# Patient Record
Sex: Female | Born: 1985 | Race: Black or African American | Hispanic: No | Marital: Single | Smoking: Current every day smoker
Health system: Southern US, Community
[De-identification: ages and names within clinical notes are randomized; demographics above are authoritative.]

## PROBLEM LIST (undated history)

## (undated) DIAGNOSIS — F1290 Cannabis use, unspecified, uncomplicated: Secondary | ICD-10-CM

## (undated) DIAGNOSIS — F1410 Cocaine abuse, uncomplicated: Secondary | ICD-10-CM

## (undated) DIAGNOSIS — K219 Gastro-esophageal reflux disease without esophagitis: Secondary | ICD-10-CM

## (undated) DIAGNOSIS — K8010 Calculus of gallbladder with chronic cholecystitis without obstruction: Secondary | ICD-10-CM

## (undated) DIAGNOSIS — R569 Unspecified convulsions: Secondary | ICD-10-CM

## (undated) DIAGNOSIS — D649 Anemia, unspecified: Secondary | ICD-10-CM

## (undated) DIAGNOSIS — R7989 Other specified abnormal findings of blood chemistry: Secondary | ICD-10-CM

## (undated) DIAGNOSIS — N390 Urinary tract infection, site not specified: Secondary | ICD-10-CM

## (undated) DIAGNOSIS — Z720 Tobacco use: Secondary | ICD-10-CM

## (undated) HISTORY — DX: Anemia, unspecified: D64.9

---
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
## 2007-09-06 ENCOUNTER — Ambulatory Visit: Payer: Self-pay | Admitting: Internal Medicine

## 2007-09-15 ENCOUNTER — Inpatient Hospital Stay: Payer: Self-pay | Admitting: Obstetrics and Gynecology

## 2007-10-04 ENCOUNTER — Encounter: Payer: Self-pay | Admitting: Obstetrics and Gynecology

## 2007-10-07 ENCOUNTER — Ambulatory Visit: Payer: Self-pay | Admitting: Internal Medicine

## 2008-01-17 ENCOUNTER — Observation Stay: Payer: Self-pay

## 2008-02-24 ENCOUNTER — Observation Stay: Payer: Self-pay | Admitting: Obstetrics and Gynecology

## 2008-02-28 ENCOUNTER — Observation Stay: Payer: Self-pay | Admitting: Obstetrics and Gynecology

## 2008-03-01 ENCOUNTER — Inpatient Hospital Stay: Payer: Self-pay | Admitting: Obstetrics and Gynecology

## 2008-03-01 ENCOUNTER — Observation Stay: Payer: Self-pay

## 2009-08-28 ENCOUNTER — Emergency Department: Payer: Self-pay | Admitting: Emergency Medicine

## 2010-07-21 ENCOUNTER — Emergency Department: Payer: Self-pay | Admitting: Emergency Medicine

## 2011-05-28 ENCOUNTER — Observation Stay: Payer: Self-pay | Admitting: Surgery

## 2011-06-02 ENCOUNTER — Ambulatory Visit: Payer: Self-pay | Admitting: Surgery

## 2011-06-07 ENCOUNTER — Ambulatory Visit: Payer: Self-pay | Admitting: Cardiothoracic Surgery

## 2011-06-19 ENCOUNTER — Ambulatory Visit: Payer: Self-pay | Admitting: Surgery

## 2014-09-18 ENCOUNTER — Emergency Department: Payer: Self-pay | Admitting: Emergency Medicine

## 2014-09-18 LAB — BASIC METABOLIC PANEL
Anion Gap: 6 — ABNORMAL LOW (ref 7–16)
BUN: 15 mg/dL (ref 7–18)
Calcium, Total: 8.9 mg/dL (ref 8.5–10.1)
Chloride: 105 mmol/L (ref 98–107)
Co2: 27 mmol/L (ref 21–32)
Creatinine: 0.96 mg/dL (ref 0.60–1.30)
EGFR (African American): >60
EGFR (Non-African Amer.): >60
Glucose: 88 mg/dL (ref 65–99)
Osmolality: 276 (ref 275–301)
Potassium: 3.4 mmol/L — ABNORMAL LOW (ref 3.5–5.1)
Sodium: 138 mmol/L (ref 136–145)

## 2014-09-18 LAB — URINALYSIS, COMPLETE
Glucose,UR: NEGATIVE mg/dL (ref 0–75)
Nitrite: NEGATIVE
Ph: 6 (ref 4.5–8.0)
Protein: 100
RBC,UR: 54 /HPF (ref 0–5)
Specific Gravity: 1.029 (ref 1.003–1.030)
Squamous Epithelial: 14
WBC UR: 3 /HPF (ref 0–5)

## 2014-09-18 LAB — WET PREP, GENITAL

## 2014-09-18 LAB — CBC
HCT: 36.8 % (ref 35.0–47.0)
HGB: 11.8 g/dL — ABNORMAL LOW (ref 12.0–16.0)
MCH: 29.9 pg (ref 26.0–34.0)
MCHC: 32.1 g/dL (ref 32.0–36.0)
MCV: 93 fL (ref 80–100)
Platelet: 174 10*3/uL (ref 150–440)
RBC: 3.95 10*6/uL (ref 3.80–5.20)
RDW: 13 % (ref 11.5–14.5)
WBC: 4.2 10*3/uL (ref 3.6–11.0)

## 2014-09-18 LAB — GC/CHLAMYDIA PROBE AMP

## 2016-12-03 ENCOUNTER — Emergency Department
Admission: EM | Admit: 2016-12-03 | Discharge: 2016-12-03 | Disposition: A | Discharge status: Home / Self Care | Payer: Self-pay | Attending: Emergency Medicine | Admitting: Emergency Medicine

## 2016-12-03 ENCOUNTER — Encounter: Payer: Self-pay | Admitting: *Deleted

## 2016-12-03 DIAGNOSIS — F17200 Nicotine dependence, unspecified, uncomplicated: Secondary | ICD-10-CM | POA: Insufficient documentation

## 2016-12-03 DIAGNOSIS — H9201 Otalgia, right ear: Secondary | ICD-10-CM

## 2016-12-03 DIAGNOSIS — H6121 Impacted cerumen, right ear: Secondary | ICD-10-CM | POA: Insufficient documentation

## 2016-12-03 MED ORDER — AMOXICILLIN 500 MG PO CAPS: 500.0000 mg | ORAL_CAPSULE | Freq: Three times a day (TID) | ORAL | 0 refills | Status: AC

## 2016-12-03 NOTE — ED Provider Notes (Signed)
[facility] Emergency Department Provider Note  ____________________________________________  Time seen: Approximately 4:38 PM  I have reviewed the triage vital signs and the nursing notes.   HISTORY  Chief Complaint Otalgia    HPI Tammy Frey is a 31 y.o. female presenting to the emergency department with 6/10 right otalgia for approximately 1 week. Patient denies hearing loss or right ear discharge. Patient states that she felt "warm" last night. However, she has not evaluated her temperature. Patient states that she had recurrent middle ear infections in childhood. Patient cannot recall her last ear infection. Patient has been taking Tylenol but has attempted no other alleviating measures. Patient denies chest pain, chest tightness, shortness of breath, abdominal pain, nausea and vomiting.    History reviewed. No pertinent past medical history.  There are no active problems to display for this patient.   History reviewed. No pertinent surgical history.  Prior to Admission medications   Medication Sig Start Date End Date Taking? Authorizing Provider  amoxicillin (AMOXIL) 500 MG capsule Take 1 capsule (500 mg total) by mouth 3 (three) times daily. 12/03/16 12/13/16  Orvil Feil, PA-C    Allergies Patient has no known allergies.  No family history on file.  Social History Social History  Substance Use Topics  . Smoking status: Current Every Day Smoker  . Smokeless tobacco: Never Used  . Alcohol use Yes     Comment: occasionally    Review of Systems  Constitutional: No fever/chills Eyes: No visual changes. No discharge ENT: Patient has right otalgia.  Cardiovascular: no chest pain. Respiratory: no cough. No SOB. Gastrointestinal: No abdominal pain.  No nausea, no vomiting.  No diarrhea.  No constipation. Skin: Negative for rash, abrasions, lacerations, ecchymosis. Neurological: Negative for headaches, focal weakness or  numbness. ____________________________________________   PHYSICAL EXAM:  VITAL SIGNS: ED Triage Vitals  Enc Vitals Group     BP 12/03/16 1559 116/75     Pulse Rate 12/03/16 1559 (!) 51     Resp 12/03/16 1559 18     Temp 12/03/16 1559 98.5 F (36.9 C)     Temp Source 12/03/16 1559 Oral     SpO2 12/03/16 1559 100 %     Weight 12/03/16 1601 140 lb (63.5 kg)     Height 12/03/16 1601 5\' 7"  (1.702 m)     Head Circumference --      Peak Flow --      Pain Score 12/03/16 1601 6     Pain Loc --      Pain Edu? --      Excl. in GC? --     Constitutional: Alert and oriented. Well appearing and in no acute distress. Eyes: Conjunctivae are normal. PERRL. EOMI. Head: Atraumatic. ENT:      Ears: Patient's right external auditory canal is erythematous. Right tympanic membrane cannot be visualized due to impacted cerumen. Left tympanic membrane is pearly without evidence of erythema or effusion. Bony landmarks visualized, left.      Nose: No congestion/rhinnorhea.      Mouth/Throat: Mucous membranes are moist.  Cardiovascular: Normal rate, regular rhythm. Normal S1 and S2.  Good peripheral circulation. Respiratory: Normal respiratory effort without tachypnea or retractions. Lungs CTAB. Good air entry to the bases with no decreased or absent breath sounds. Gastrointestinal: Bowel sounds 4 quadrants. Soft and nontender to palpation. No guarding or rigidity. No palpable masses. No distention. No CVA tenderness. Musculoskeletal: Full range of motion to all extremities. No gross deformities appreciated.  Neurologic:  Normal speech and language. No gross focal neurologic deficits are appreciated.  Skin:  Skin is warm, dry and intact. No rash noted. ___________________________________________   LABS (all labs ordered are listed, but only abnormal results are displayed)  Labs Reviewed - No data to  display ____________________________________________  EKG   ____________________________________________  RADIOLOGY  No results found.  ____________________________________________    PROCEDURES  Procedure(s) performed:    Procedures  Cerumen disimpaction was attempted with a Q-tip.  Medications - No data to display   ____________________________________________   INITIAL IMPRESSION / ASSESSMENT AND PLAN / ED COURSE  Pertinent labs & imaging results that were available during my care of the patient were reviewed by me and considered in my medical decision making (see chart for details).  Review of the [place] CSRS was performed in accordance of the [facility] prior to dispensing any controlled drugs.     Assessment and Plan:  Right Otalgia:  Patient presents to the emergency department with right otalgia for approximately 1 week. Right tympanic membrane cannot be entirely visualized due to impacted cerumen despite attempted cerumen disimpaction in the emergency department. Patient was treated empirically with amoxicillin. Patient was advised to follow-up with her primary care provider in one week for further cerumen disempaction. All patient questions were answered.   ____________________________________________  FINAL CLINICAL IMPRESSION(S) / ED DIAGNOSES  Final diagnoses:  Acute otalgia, right      NEW MEDICATIONS STARTED DURING THIS VISIT:  New Prescriptions   AMOXICILLIN (AMOXIL) 500 MG CAPSULE    Take 1 capsule (500 mg total) by mouth 3 (three) times daily.        This chart was dictated using voice recognition software/Dragon. Despite best efforts to proofread, errors can occur which can change the meaning. Any change was purely unintentional.    Orvil FeilJaclyn M Indiyah Paone, PA-C 12/03/16 1652    Minna AntisKevin Paduchowski, MD 12/06/16 [phone]

## 2016-12-03 NOTE — ED Triage Notes (Signed)
Patient c/o right ear infection.

## 2017-03-05 ENCOUNTER — Encounter: Payer: Self-pay | Admitting: Emergency Medicine

## 2017-03-05 ENCOUNTER — Emergency Department
Admission: EM | Admit: 2017-03-05 | Discharge: 2017-03-05 | Disposition: A | Discharge status: Home / Self Care | Payer: Medicaid Other | Attending: Emergency Medicine | Admitting: Emergency Medicine

## 2017-03-05 DIAGNOSIS — F10921 Alcohol use, unspecified with intoxication delirium: Secondary | ICD-10-CM

## 2017-03-05 DIAGNOSIS — F1910 Other psychoactive substance abuse, uncomplicated: Secondary | ICD-10-CM

## 2017-03-05 DIAGNOSIS — F10121 Alcohol abuse with intoxication delirium: Secondary | ICD-10-CM | POA: Insufficient documentation

## 2017-03-05 DIAGNOSIS — F17200 Nicotine dependence, unspecified, uncomplicated: Secondary | ICD-10-CM | POA: Insufficient documentation

## 2017-03-05 DIAGNOSIS — F1210 Cannabis abuse, uncomplicated: Secondary | ICD-10-CM | POA: Insufficient documentation

## 2017-03-05 HISTORY — DX: Unspecified convulsions: R56.9

## 2017-03-05 LAB — BASIC METABOLIC PANEL
Anion gap: 8 (ref 5–15)
BUN: 8 mg/dL (ref 6–20)
CO2: 25 mmol/L (ref 22–32)
Calcium: 8.7 mg/dL — ABNORMAL LOW (ref 8.9–10.3)
Chloride: 110 mmol/L (ref 101–111)
Creatinine, Ser: 0.9 mg/dL (ref 0.44–1.00)
GFR calc Af Amer: >60 mL/min (ref >60)
GFR calc non Af Amer: >60 mL/min (ref >60)
Glucose, Bld: 86 mg/dL (ref 65–99)
Potassium: 3.1 mmol/L — ABNORMAL LOW (ref 3.5–5.1)
Sodium: 143 mmol/L (ref 135–145)

## 2017-03-05 LAB — GLUCOSE, CAPILLARY: Glucose-Capillary: 102 mg/dL — ABNORMAL HIGH (ref 65–99)

## 2017-03-05 LAB — CBC
HCT: 36.6 % (ref 35.0–47.0)
Hemoglobin: 12.4 g/dL (ref 12.0–16.0)
MCH: 32 pg (ref 26.0–34.0)
MCHC: 33.7 g/dL (ref 32.0–36.0)
MCV: 94.9 fL (ref 80.0–100.0)
Platelets: 164 10*3/uL (ref 150–440)
RBC: 3.86 MIL/uL (ref 3.80–5.20)
RDW: 13.3 % (ref 11.5–14.5)
WBC: 3.9 10*3/uL (ref 3.6–11.0)

## 2017-03-05 LAB — ETHANOL: Alcohol, Ethyl (B): 177 mg/dL — ABNORMAL HIGH (ref <5)

## 2017-03-05 MED ORDER — LORAZEPAM 2 MG/ML IJ SOLN
2.0000 mg | Freq: Once | INTRAMUSCULAR | Status: AC
  Administered 2017-03-05: 2 mg via INTRAMUSCULAR

## 2017-03-05 MED ORDER — HALOPERIDOL LACTATE 5 MG/ML IJ SOLN
5.0000 mg | Freq: Once | INTRAMUSCULAR | Status: AC
  Administered 2017-03-05: 5 mg via INTRAMUSCULAR

## 2017-03-05 MED ORDER — DIPHENHYDRAMINE HCL 50 MG/ML IJ SOLN
50.0000 mg | Freq: Once | INTRAMUSCULAR | Status: AC
  Administered 2017-03-05: 50 mg via INTRAMUSCULAR

## 2017-03-05 NOTE — ED Notes (Signed)
Mom in room with patient. Mom admits that patient has been using drugs and snorting stuff. Unable to get urine sample at this time

## 2017-03-05 NOTE — ED Provider Notes (Signed)
[facility] Emergency Department Provider Note    First MD Initiated Contact with Patient 03/05/17 [phone]     (approximate)  I have reviewed the triage vital signs and the nursing notes.  Level V caveat: Altered mental status combative HISTORY  Chief Complaint Altered Mental Status    HPI Tammy Frey is a 31 y.o. female presents to the emergency department via EMS with altered mental status. Per EMS the patient was drinking alcohol and smoking marijuana subsequently became very agitated. Patient very combative upon arrival to the emergency department actively grabbing with the EMS personnel via the neck.   Past Medical History:  Diagnosis Date  . Seizures (HCC)     There are no active problems to display for this patient.   History reviewed. No pertinent surgical history.  Prior to Admission medications   Not on File    Allergies None per the patient's mother.   Family history Noncontributory  Social History Social History  Substance Use Topics  . Smoking status: Current Every Day Smoker  . Smokeless tobacco: Never Used  . Alcohol use Yes     Comment: occasionally    Review of Systems Constitutional: No fever/chills Eyes: No visual changes. ENT: No sore throat. Cardiovascular: Denies chest pain. Respiratory: Denies shortness of breath. Gastrointestinal: No abdominal pain.  No nausea, no vomiting.  No diarrhea.  No constipation. Genitourinary: Negative for dysuria. Musculoskeletal: Negative for neck pain.  Negative for back pain. Integumentary: Negative for rash. Neurological: Negative for headaches, focal weakness or numbness. Psychiatric:Positive for EtOH ingestion and marijuana use tonight. Altered mental status   ____________________________________________   PHYSICAL EXAM:  VITAL SIGNS: ED Triage Vitals  Enc Vitals Group     BP 03/05/17 0440 113/72     Pulse Rate 03/05/17 0440 83     Resp 03/05/17 0440 20     Temp 03/05/17 0440 98.4 F (36.9 C)     Temp Source 03/05/17 0440 Oral     SpO2 03/05/17 0440 97 %     Weight 03/05/17 0441 63.9 kg (140 lb 14 oz)     Height 03/05/17 0441 1.676 m (5\' 6" )     Head Circumference --      Peak Flow --      Pain Score --      Pain Loc --      Pain Edu? --      Excl. in GC? --     Constitutional: Alert, combative agitated  Eyes: Conjunctivae are normal.  Head: Atraumatic. Mouth/Throat: Mucous membranes are moist. Oropharynx non-erythematous. Neck: No stridor. Cardiovascular: Normal rate, regular rhythm. Good peripheral circulation. Grossly normal heart sounds. Respiratory: Normal respiratory effort.  No retractions. Lungs CTAB. Gastrointestinal: Soft and nontender. No distention.  Musculoskeletal: No lower extremity tenderness nor edema. No gross deformities of extremities. Neurologic:  Normal speech and language. No gross focal neurologic deficits are appreciated.  Skin:  Skin is warm, dry and intact. No rash noted. Psychiatric: Combative, agitated, ____________________________________________   LABS (all labs ordered are listed, but only abnormal results are displayed)  Labs Reviewed  GLUCOSE, CAPILLARY - Abnormal; Notable for the following:       Result Value   Glucose-Capillary 102 (*)    All other components within normal limits  BASIC METABOLIC PANEL - Abnormal; Notable for the following:    Potassium 3.1 (*)    Calcium 8.7 (*)    All other components within normal limits  ETHANOL - Abnormal; Notable for  the following:    Alcohol, Ethyl (B) 177 (*)    All other components within normal limits  CBC  URINE DRUG SCREEN, QUALITATIVE ([facility] ONLY)   _  Procedures   ____________________________________________   INITIAL IMPRESSION / ASSESSMENT AND PLAN / ED COURSE  Pertinent labs & imaging results that were available during my care of the patient were reviewed by me and considered in my medical decision making (see chart for  details).  Patient presents the emergency department combative and agitated and salts posing a risk not only to herself but staff. Given this clinical finding patient received Haldol 5 mg Ativan 2 mg and Benadryl 50 mg for chemical sedation. Patient's combativeness and agitation gradually improved to resolution within 20 minutes.  Patient's mother arrived to the emergency department and stated that the patient had a similar episode like this before secondary to drug use. She states that she is unsure what drug to her daughter uses however she was told in the past while her daughter that she "snorts".    ----------------------------------------- 6:27 AM on 03/05/2017 -----------------------------------------   OBSERVATION CARE: This patient is being placed under observation care for the following reasons: Alcohol intoxication, altered mental status. 31 year old female presenting with combativeness and altered mental status. EtOH level 177 urine drug screen pending at this time.      FINAL CLINICAL IMPRESSION(S) / ED DIAGNOSES  Final diagnoses:  Alcohol intoxication with delirium (HCC)  Substance abuse     MEDICATIONS GIVEN DURING THIS VISIT:  Medications  diphenhydrAMINE (BENADRYL) injection 50 mg (50 mg Intramuscular Given 03/05/17 0420)  LORazepam (ATIVAN) injection 2 mg (2 mg Intramuscular Given 03/05/17 0420)  haloperidol lactate (HALDOL) injection 5 mg (5 mg Intramuscular Given 03/05/17 0420)     NEW OUTPATIENT MEDICATIONS STARTED DURING THIS VISIT:  New Prescriptions   No medications on file    Modified Medications   No medications on file    Discontinued Medications   No medications on file     Note:  This document was prepared using Dragon voice recognition software and may include unintentional dictation errors.    Darci CurrentBrown, Penryn N, MD 03/05/17 [phone]

## 2017-03-05 NOTE — ED Notes (Signed)
Pt resting after obtaining blood specimens. Nurse unable to finish triage notes due to patient being sleep.

## 2017-03-05 NOTE — ED Triage Notes (Signed)
Pt coming in from EMS combative and AMS. Friend called EMS and said alcohol was involved and she had smoked some weed. EMS states they were told no allergies and hx of seizures. Pt was screaming that she wanted to leave and talking about her daughter Victory DakinRiley.

## 2017-03-05 NOTE — ED Provider Notes (Signed)
-----------------------------------------   8:34 AM on 03/05/2017 -----------------------------------------  The patient is resting comfortably. Still unclear if she will require inpatient admission for her possible drug overdose or not. Reevaluation pending.  ----------------------------------------- 10:47 AM on 03/05/2017 -----------------------------------------   END OF OBSERVATION STATUS: After an appropriate period of observation, this patient is being discharged due to the following reason(s):  Awake and appropriate with normal hemodynamics as well as able to eat and drink. Discharged home with her mom.    Merrily Brittleifenbark, Iven Earnhart, MD 03/05/17 1047

## 2017-03-05 NOTE — Discharge Instructions (Signed)
Please make an appointment to establish care with a primary care physician within the next week for recheck. Return to the emergency department sooner for any concerns.  It was a pleasure to take care of you today, and thank you for coming to our emergency department.  If you have any questions or concerns before leaving please ask the nurse to grab me and I'm more than happy to go through your aftercare instructions again.  If you were prescribed any opioid pain medication today such as Norco, Vicodin, Percocet, morphine, hydrocodone, or oxycodone please make sure you do not drive when you are taking this medication as it can alter your ability to drive safely.  If you have any concerns once you are home that you are not improving or are in fact getting worse before you can make it to your follow-up appointment, please do not hesitate to call 911 and come back for further evaluation.  Merrily BrittleNeil Shanda Cadotte MD  Results for orders placed or performed during the hospital encounter of 03/05/17  Glucose, capillary  Result Value Ref Range   Glucose-Capillary 102 (H) 65 - 99 mg/dL  CBC  Result Value Ref Range   WBC 3.9 3.6 - 11.0 K/uL   RBC 3.86 3.80 - 5.20 MIL/uL   Hemoglobin 12.4 12.0 - 16.0 g/dL   HCT 16.136.6 09.635.0 - 04.547.0 %   MCV 94.9 80.0 - 100.0 fL   MCH 32.0 26.0 - 34.0 pg   MCHC 33.7 32.0 - 36.0 g/dL   RDW 40.913.3 81.111.5 - 91.414.5 %   Platelets 164 150 - 440 K/uL  Basic metabolic panel  Result Value Ref Range   Sodium 143 135 - 145 mmol/L   Potassium 3.1 (L) 3.5 - 5.1 mmol/L   Chloride 110 101 - 111 mmol/L   CO2 25 22 - 32 mmol/L   Glucose, Bld 86 65 - 99 mg/dL   BUN 8 6 - 20 mg/dL   Creatinine, Ser 7.820.90 0.44 - 1.00 mg/dL   Calcium 8.7 (L) 8.9 - 10.3 mg/dL   GFR calc non Af Amer >60 >60 mL/min   GFR calc Af Amer >60 >60 mL/min   Anion gap 8 5 - 15  Ethanol  Result Value Ref Range   Alcohol, Ethyl (B) 177 (H) <5 mg/dL

## 2017-03-05 NOTE — ED Notes (Signed)
Pt resting in bed, family at bedside, resp even and unlabored, eyes closed 

## 2017-03-05 NOTE — ED Notes (Signed)
Pt resting in bed.  Family at bedside.

## 2017-03-06 ENCOUNTER — Emergency Department
Admission: EM | Admit: 2017-03-06 | Discharge: 2017-03-06 | Disposition: A | Discharge status: Home / Self Care | Payer: Medicaid Other | Attending: Emergency Medicine | Admitting: Emergency Medicine

## 2017-03-06 DIAGNOSIS — F1290 Cannabis use, unspecified, uncomplicated: Secondary | ICD-10-CM | POA: Insufficient documentation

## 2017-03-06 DIAGNOSIS — T424X5A Adverse effect of benzodiazepines, initial encounter: Secondary | ICD-10-CM | POA: Insufficient documentation

## 2017-03-06 DIAGNOSIS — F17200 Nicotine dependence, unspecified, uncomplicated: Secondary | ICD-10-CM | POA: Insufficient documentation

## 2017-03-06 DIAGNOSIS — F1910 Other psychoactive substance abuse, uncomplicated: Secondary | ICD-10-CM | POA: Insufficient documentation

## 2017-03-06 DIAGNOSIS — G2402 Drug induced acute dystonia: Secondary | ICD-10-CM | POA: Insufficient documentation

## 2017-03-06 LAB — CBC WITH DIFFERENTIAL/PLATELET
Basophils Absolute: 0.1 10*3/uL (ref 0–0.1)
Basophils Relative: 1 %
Eosinophils Absolute: 0 10*3/uL (ref 0–0.7)
Eosinophils Relative: 1 %
HCT: 42.9 % (ref 35.0–47.0)
Hemoglobin: 14.2 g/dL (ref 12.0–16.0)
Lymphocytes Relative: 27 %
Lymphs Abs: 2 10*3/uL (ref 1.0–3.6)
MCH: 31.5 pg (ref 26.0–34.0)
MCHC: 33.2 g/dL (ref 32.0–36.0)
MCV: 94.9 fL (ref 80.0–100.0)
Monocytes Absolute: 0.8 10*3/uL (ref 0.2–0.9)
Monocytes Relative: 10 %
Neutro Abs: 4.6 10*3/uL (ref 1.4–6.5)
Neutrophils Relative %: 61 %
Platelets: 208 10*3/uL (ref 150–440)
RBC: 4.53 MIL/uL (ref 3.80–5.20)
RDW: 12.9 % (ref 11.5–14.5)
WBC: 7.5 10*3/uL (ref 3.6–11.0)

## 2017-03-06 LAB — ETHANOL: Alcohol, Ethyl (B): <5 mg/dL (ref <5)

## 2017-03-06 LAB — COMPREHENSIVE METABOLIC PANEL
ALT: 21 U/L (ref 14–54)
AST: 47 U/L — ABNORMAL HIGH (ref 15–41)
Albumin: 5.2 g/dL — ABNORMAL HIGH (ref 3.5–5.0)
Alkaline Phosphatase: 53 U/L (ref 38–126)
Anion gap: 13 (ref 5–15)
BUN: 15 mg/dL (ref 6–20)
CO2: 21 mmol/L — ABNORMAL LOW (ref 22–32)
Calcium: 9.7 mg/dL (ref 8.9–10.3)
Chloride: 105 mmol/L (ref 101–111)
Creatinine, Ser: 1.13 mg/dL — ABNORMAL HIGH (ref 0.44–1.00)
GFR calc Af Amer: >60 mL/min (ref >60)
GFR calc non Af Amer: >60 mL/min (ref >60)
Glucose, Bld: 114 mg/dL — ABNORMAL HIGH (ref 65–99)
Potassium: 4.4 mmol/L (ref 3.5–5.1)
Sodium: 139 mmol/L (ref 135–145)
Total Bilirubin: 0.7 mg/dL (ref 0.3–1.2)
Total Protein: 8.8 g/dL — ABNORMAL HIGH (ref 6.5–8.1)

## 2017-03-06 LAB — URINE DRUG SCREEN, QUALITATIVE (ARMC ONLY)
Amphetamines, Ur Screen: NOT DETECTED
Barbiturates, Ur Screen: NOT DETECTED
Benzodiazepine, Ur Scrn: POSITIVE — AB
Cannabinoid 50 Ng, Ur ~~LOC~~: POSITIVE — AB
Cocaine Metabolite,Ur ~~LOC~~: POSITIVE — AB
MDMA (Ecstasy)Ur Screen: NOT DETECTED
Methadone Scn, Ur: NOT DETECTED
Opiate, Ur Screen: NOT DETECTED
Phencyclidine (PCP) Ur S: NOT DETECTED
Tricyclic, Ur Screen: NOT DETECTED

## 2017-03-06 LAB — POCT PREGNANCY, URINE: Preg Test, Ur: NEGATIVE

## 2017-03-06 MED ORDER — SODIUM CHLORIDE 0.9 % IV SOLN
1000.0000 mL | Freq: Once | INTRAVENOUS | Status: AC
  Administered 2017-03-06: 1000 mL via INTRAVENOUS

## 2017-03-06 MED ORDER — DIPHENHYDRAMINE HCL 50 MG/ML IJ SOLN
50.0000 mg | Freq: Once | INTRAMUSCULAR | Status: AC
  Administered 2017-03-06: 50 mg via INTRAVENOUS
  Filled 2017-03-06: qty 1

## 2017-03-06 MED ORDER — LORAZEPAM 2 MG/ML IJ SOLN
1.0000 mg | Freq: Once | INTRAMUSCULAR | Status: AC
  Administered 2017-03-06: 1 mg via INTRAVENOUS
  Filled 2017-03-06: qty 1

## 2017-03-06 MED ORDER — BENZTROPINE MESYLATE 1 MG/ML IJ SOLN
1.0000 mg | Freq: Once | INTRAMUSCULAR | Status: AC
  Administered 2017-03-06: 1 mg via INTRAVENOUS
  Filled 2017-03-06: qty 1

## 2017-03-06 NOTE — ED Notes (Signed)
ED Provider at bedside. 

## 2017-03-06 NOTE — ED Notes (Signed)
Patient resting more comfortably at this time.  Patient able to sit still on the bed and follow directions.  Patient states "I am not twitching as much anymore".  MD notified of improvement post medication administration.  Family of patient still at bedside.

## 2017-03-06 NOTE — ED Notes (Signed)
Patient able to ambulate well to the restroom with no difficulty noted at this time.

## 2017-03-06 NOTE — ED Triage Notes (Signed)
Patient c/o that she is currently seizing. Pt is able to speak and communicate needs. Pt c/o muscle spasm in lower left leg.

## 2017-03-06 NOTE — ED Provider Notes (Signed)
[facility] Emergency Department Provider Note       Time seen: ----------------------------------------- 9:02 PM on 03/06/2017 -----------------------------------------     I have reviewed the triage vital signs and the nursing notes.   HISTORY   Chief Complaint Seizures    HPI Tammy Frey is a 31 y.o. female who presents to the ED for possible seizure. Patient is brought emergently to room #8 for seizure-like activity. She is able to speak and communicate but has obvious spasm of her neck and left leg. Patient was seen in the ER yesterday for alcohol intoxication and possible pseudoseizures. She did receive Ativan, Haldol and Benadryl at that time.   Past Medical History:  Diagnosis Date  . Seizures (HCC)     There are no active problems to display for this patient.   History reviewed. No pertinent surgical history.  Allergies Patient has no known allergies.  Social History Social History  Substance Use Topics  . Smoking status: Current Every Day Smoker  . Smokeless tobacco: Never Used  . Alcohol use Yes     Comment: occasionally    Review of Systems Constitutional: Negative for fever. Eyes: Negative for vision changes ENT:  Negative for congestion, sore throat Cardiovascular: Negative for chest pain. Respiratory: Negative for shortness of breath. Gastrointestinal: Negative for abdominal pain, vomiting and diarrhea. Genitourinary: Negative for dysuria. Musculoskeletal: Positive for leg pain and spasm in the neck Skin: Positive for diaphoresis Neurological: Positive for possible seizure-like event.  All systems negative/normal/unremarkable except as stated in the HPI  ____________________________________________   PHYSICAL EXAM:  VITAL SIGNS: ED Triage Vitals  Enc Vitals Group     BP 03/06/17 2042 (!) 166/123     Pulse Rate 03/06/17 2042 (!) 185     Resp 03/06/17 2042 (!) 24     Temp 03/06/17 2042 99.7 F (37.6  C)     Temp Source 03/06/17 2042 Oral     SpO2 03/06/17 2042 100 %     Weight 03/06/17 2041 150 lb (68 kg)     Height 03/06/17 2041 5\' 9"  (1.753 m)     Head Circumference --      Peak Flow --      Pain Score --      Pain Loc --      Pain Edu? --      Excl. in GC? --     Constitutional: Alert and oriented. Moderate distress Eyes: Conjunctivae are normal. Normal extraocular movements. ENT   Head: Normocephalic, facial grimacing   Nose: No congestion/rhinnorhea.   Mouth/Throat: Mucous membranes are moist.   Neck: No stridor. Left-sided torticollis Cardiovascular: Normal rate, regular rhythm. No murmurs, rubs, or gallops. Respiratory: Normal respiratory effort without tachypnea nor retractions. Breath sounds are clear and equal bilaterally. No wheezes/rales/rhonchi. Gastrointestinal: Soft and nontender. Normal bowel sounds Musculoskeletal: Nontender with normal range of motion in extremities. Palpable spasm of the left lower extremity gastrocnemius muscle, plantar flexion Neurologic:  Normal speech and language. No gross focal neurologic deficits are appreciated.  Skin:  Skin is warm, dry and intact. No rash noted. Psychiatric: Mood and affect are normal. Speech and behavior are normal.  ____________________________________________  EKG: Interpreted by me. Sinus tachycardia with rate of 175 bpm, normal. We'll, normal QRS, normal QT.  Repeat EKG interpreted by me reveals sinus arrhythmia with a rate of 65 bpm, normal PR, normal QRS, normal QT.  ____________________________________________  ED COURSE:  Pertinent labs & imaging results that were available during my care  of the patient were reviewed by me and considered in my medical decision making (see chart for details). Patient presents for Possible dystonic reaction, we will assess with labs and imaging as indicated. Clinical Course as of Mar 06 2237  Fri Mar 06, 2017  2114 Patient has had resolution in her dystonic  reaction after Benadryl and Cogentin  [JW]    Clinical Course User Index [JW] Emily FilbertWilliams, Jonathan E, MD   Procedures ____________________________________________   LABS (pertinent positives/negatives)  Labs Reviewed  COMPREHENSIVE METABOLIC PANEL - Abnormal; Notable for the following:       Result Value   CO2 21 (*)    Glucose, Bld 114 (*)    Creatinine, Ser 1.13 (*)    Total Protein 8.8 (*)    Albumin 5.2 (*)    AST 47 (*)    All other components within normal limits  URINE DRUG SCREEN, QUALITATIVE ([facility] ONLY) - Abnormal; Notable for the following:    Cocaine Metabolite,Ur [place] POSITIVE (*)    Cannabinoid 50 Ng, Ur [place] POSITIVE (*)    Benzodiazepine, Ur [place] POSITIVE (*)    All other components within normal limits  CBC WITH DIFFERENTIAL/PLATELET  ETHANOL  POC URINE PREG, ED  POCT PREGNANCY, URINE  CBG MONITORING, ED   ____________________________________________  FINAL ASSESSMENT AND PLAN  Dystonic reaction, Polysubstance abuse  Plan: Patient's labs and imaging were dictated above. Patient had presented for a dystonic reaction that evidently triggered SVT. After Benadryl and Cogentin symptoms have resolved. She is currently back to her baseline. 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 She is stable for outpatient follow-up.   Emily FilbertWilliams, Jonathan E, MD   Note: This note was generated in part or whole with voice recognition software. Voice recognition is usually quite accurate but there are transcription errors that can and very often do occur. I apologize for any typographical errors that were not detected and corrected.     Emily FilbertWilliams, Jonathan E, MD 03/06/17 2241

## 2017-03-06 NOTE — ED Notes (Signed)
MD made aware that patient's heart rate is 180's and patient in diaphoretic with intense spasms.  MD at brought to bedside for assessment.

## 2017-05-14 ENCOUNTER — Encounter: Payer: Self-pay | Admitting: Emergency Medicine

## 2017-05-14 ENCOUNTER — Emergency Department
Admission: EM | Admit: 2017-05-14 | Discharge: 2017-05-14 | Disposition: A | Discharge status: Home / Self Care | Payer: Medicaid Other | Attending: Emergency Medicine | Admitting: Emergency Medicine

## 2017-05-14 DIAGNOSIS — F17200 Nicotine dependence, unspecified, uncomplicated: Secondary | ICD-10-CM | POA: Insufficient documentation

## 2017-05-14 DIAGNOSIS — A599 Trichomoniasis, unspecified: Secondary | ICD-10-CM | POA: Insufficient documentation

## 2017-05-14 DIAGNOSIS — B9689 Other specified bacterial agents as the cause of diseases classified elsewhere: Secondary | ICD-10-CM

## 2017-05-14 DIAGNOSIS — N760 Acute vaginitis: Secondary | ICD-10-CM | POA: Insufficient documentation

## 2017-05-14 DIAGNOSIS — A749 Chlamydial infection, unspecified: Secondary | ICD-10-CM

## 2017-05-14 DIAGNOSIS — Z113 Encounter for screening for infections with a predominantly sexual mode of transmission: Secondary | ICD-10-CM | POA: Insufficient documentation

## 2017-05-14 LAB — URINALYSIS, COMPLETE (UACMP) WITH MICROSCOPIC
Bacteria, UA: NONE SEEN
Bilirubin Urine: NEGATIVE
Glucose, UA: NEGATIVE mg/dL
Hgb urine dipstick: NEGATIVE
Ketones, ur: 5 mg/dL — AB
Nitrite: NEGATIVE
Protein, ur: NEGATIVE mg/dL
Specific Gravity, Urine: 1.024 (ref 1.005–1.030)
pH: 6 (ref 5.0–8.0)

## 2017-05-14 LAB — CHLAMYDIA/NGC RT PCR (ARMC ONLY)
Chlamydia Tr: DETECTED — AB
N gonorrhoeae: NOT DETECTED

## 2017-05-14 LAB — POCT PREGNANCY, URINE: Preg Test, Ur: NEGATIVE

## 2017-05-14 LAB — WET PREP, GENITAL
Sperm: NONE SEEN
Yeast Wet Prep HPF POC: NONE SEEN

## 2017-05-14 MED ORDER — LIDOCAINE HCL (PF) 1 % IJ SOLN
INTRAMUSCULAR | Status: AC
  Filled 2017-05-14: qty 5

## 2017-05-14 MED ORDER — AZITHROMYCIN 500 MG PO TABS
1000.0000 mg | ORAL_TABLET | Freq: Once | ORAL | Status: AC
  Administered 2017-05-14: 1000 mg via ORAL
  Filled 2017-05-14: qty 2

## 2017-05-14 MED ORDER — CEFTRIAXONE SODIUM 250 MG IJ SOLR
250.0000 mg | Freq: Once | INTRAMUSCULAR | Status: AC
  Administered 2017-05-14: 250 mg via INTRAMUSCULAR
  Filled 2017-05-14: qty 250

## 2017-05-14 MED ORDER — FLUCONAZOLE 150 MG PO TABS: 150.0000 mg | ORAL_TABLET | Freq: Once | ORAL | 0 refills | Status: AC

## 2017-05-14 MED ORDER — METRONIDAZOLE 500 MG PO TABS: 500.0000 mg | ORAL_TABLET | Freq: Two times a day (BID) | ORAL | 0 refills | Status: DC

## 2017-05-14 NOTE — ED Triage Notes (Signed)
Pt reports someone told her she needed to get treated for STD.  Reports person she was with had epididymitis.  Pt has no symptoms.

## 2017-05-14 NOTE — ED Provider Notes (Signed)
[facility] Emergency Department Provider Note  ____________________________________________  Time seen: Approximately 7:26 PM  I have reviewed the triage vital signs and the nursing notes.   HISTORY  Chief Complaint STD    HPI Tammy Frey is a 31 y.o. female who presents to the emergency department complaining of possible STD exposure. Patient reports that recent sexual partner was diagnosed with epididymitis. She is unsure causative organism. She reports that she received a text saying she needed to be tested. Patient denies any external lesions, vaginal discharge or bleeding. Patient denies any abdominal pain, dysuria, polyuria, hematuria. No fevers or chills. No other complaints at this time.     Past Medical History:  Diagnosis Date  . Seizures (HCC)     There are no active problems to display for this patient.   History reviewed. No pertinent surgical history.  Prior to Admission medications   Medication Sig Start Date End Date Taking? Authorizing Provider  fluconazole (DIFLUCAN) 150 MG tablet Take 1 tablet (150 mg total) by mouth once. Take after finishing antibiotic 05/14/17 05/14/17  Israel Werts, Delorise RoyalsJonathan D, PA-C  metroNIDAZOLE (FLAGYL) 500 MG tablet Take 1 tablet (500 mg total) by mouth 2 (two) times daily. 05/14/17   Brigid Vandekamp, Delorise RoyalsJonathan D, PA-C    Allergies Haldol [haloperidol]  History reviewed. No pertinent family history.  Social History Social History  Substance Use Topics  . Smoking status: Current Every Day Smoker  . Smokeless tobacco: Never Used  . Alcohol use Yes     Comment: occasionally     Review of Systems  Constitutional: No fever/chills Eyes: No visual changes. No discharge ENT: No upper respiratory complaints. Cardiovascular: no chest pain. Respiratory: no cough. No SOB. Gastrointestinal: No abdominal pain.  No nausea, no vomiting.  No diarrhea.  No constipation. Genitourinary: Negative for dysuria. No  hematuria. Denies vaginal discharge or bleeding. Musculoskeletal: Negative for musculoskeletal pain. Skin: Negative for rash, abrasions, lacerations, ecchymosis. Neurological: Negative for headaches, focal weakness or numbness. 10-point ROS otherwise negative.  ____________________________________________   PHYSICAL EXAM:  VITAL SIGNS: ED Triage Vitals [05/14/17 1844]  Enc Vitals Group     BP (!) 141/90     Pulse Rate 73     Resp      Temp 98.6 F (37 C)     Temp Source Oral     SpO2 100 %     Weight 145 lb (65.8 kg)     Height 5\' 7"  (1.702 m)     Head Circumference      Peak Flow      Pain Score      Pain Loc      Pain Edu?      Excl. in [place]?      Constitutional: Alert and oriented. Well appearing and in no acute distress. Eyes: Conjunctivae are normal. PERRL. EOMI. Head: Atraumatic. ENT:      Ears:       Nose: No congestion/rhinnorhea.      Mouth/Throat: Mucous membranes are moist.  Neck: No stridor.    Cardiovascular: Normal rate, regular rhythm. Normal S1 and S2.  Good peripheral circulation. Respiratory: Normal respiratory effort without tachypnea or retractions. Lungs CTAB. Good air entry to the bases with no decreased or absent breath sounds. Gastrointestinal: Bowel sounds 4 quadrants. Soft and nontender to palpation. No guarding or rigidity. No palpable masses. No distention. No CVA tenderness. Genitourinary: No visible external lesions. Visualization with speculum reveals no lesions or lacerations to the vaginal wall. Bowel other  is appreciated. Patient does have thick white green discharge. Cervix is unremarkable. Bimanual exam reveals no cervical motion tenderness. No palpable masses in the adnexa.. Musculoskeletal: Full range of motion to all extremities. No gross deformities appreciated. Neurologic:  Normal speech and language. No gross focal neurologic deficits are appreciated.  Skin:  Skin is warm, dry and intact. No rash noted. Psychiatric: Mood and  affect are normal. Speech and behavior are normal. Patient exhibits appropriate insight and judgement.  Pelvic exam chaperoned by ED tech ____________________________________________   LABS (all labs ordered are listed, but only abnormal results are displayed)  Labs Reviewed  WET PREP, GENITAL - Abnormal; Notable for the following:       Result Value   Trich, Wet Prep PRESENT (*)    Clue Cells Wet Prep HPF POC PRESENT (*)    WBC, Wet Prep HPF POC MANY (*)    All other components within normal limits  CHLAMYDIA/NGC RT PCR ([facility] ONLY) - Abnormal; Notable for the following:    Chlamydia Tr DETECTED (*)    All other components within normal limits  URINALYSIS, COMPLETE (UACMP) WITH MICROSCOPIC - Abnormal; Notable for the following:    Color, Urine YELLOW (*)    APPearance CLEAR (*)    Ketones, ur 5 (*)    Leukocytes, UA SMALL (*)    Squamous Epithelial / LPF 0-5 (*)    All other components within normal limits  POC URINE PREG, ED  POCT PREGNANCY, URINE   ____________________________________________  EKG   ____________________________________________  RADIOLOGY   No results found.  ____________________________________________    PROCEDURES  Procedure(s) performed:    Procedures    Medications  lidocaine (PF) (XYLOCAINE) 1 % injection (not administered)  cefTRIAXone (ROCEPHIN) injection 250 mg (250 mg Intramuscular Given 05/14/17 2202)  azithromycin (ZITHROMAX) tablet 1,000 mg (1,000 mg Oral Given 05/14/17 2206)     ____________________________________________   INITIAL IMPRESSION / ASSESSMENT AND PLAN / ED COURSE  Pertinent labs & imaging results that were available during my care of the patient were reviewed by me and considered in my medical decision making (see chart for details).  Review of the [place] CSRS was performed in accordance of the [facility] prior to dispensing any controlled drugs.     Patient's diagnosis is consistent with Chlamydia,  trichomoniasis, BV. Patient presented to the emergency department with complaint of possible exposure to STD. Patient denied any other complaints at that time. Labs reveal the above diagnosis. Patient is given Rocephin and Zithromax in the emergency department and will be discharged home with Flagyl and Diflucan. With multiple positive findings, I have advised patient to follow up with health Department in 3 weeks for repeat testing to ensure clearance as well as to perform further STD testing. Patient verbalizes understanding and compliance with plan.. Patient is given ED precautions to return to the ED for any worsening or new symptoms.     ____________________________________________  FINAL CLINICAL IMPRESSION(S) / ED DIAGNOSES  Final diagnoses:  Chlamydia  Trichomoniasis  BV (bacterial vaginosis)      NEW MEDICATIONS STARTED DURING THIS VISIT:  Discharge Medication List as of 05/14/2017  9:53 PM    START taking these medications   Details  fluconazole (DIFLUCAN) 150 MG tablet Take 1 tablet (150 mg total) by mouth once. Take after finishing antibiotic, Starting Thu 05/14/2017, Print    metroNIDAZOLE (FLAGYL) 500 MG tablet Take 1 tablet (500 mg total) by mouth 2 (two) times daily., Starting Thu 05/14/2017, Print  This chart was dictated using voice recognition software/Dragon. Despite best efforts to proofread, errors can occur which can change the meaning. Any change was purely unintentional.    Racheal Patches, PA-C 05/14/17 2359    Emily Filbert, MD 05/20/17 [phone]

## 2019-01-06 ENCOUNTER — Other Ambulatory Visit (HOSPITAL_COMMUNITY): Payer: Self-pay | Admitting: Family Medicine

## 2019-01-06 LAB — PREGNANCY, URINE: Preg Test, Ur: NEGATIVE

## 2019-01-06 LAB — HEMOGLOBIN, FINGERSTICK: Hemoglobin: 12.7 g/dL (ref 11.1–15.9)

## 2019-01-06 LAB — WET PREP FOR TRICH, YEAST, CLUE
Trichomonas Exam: POSITIVE — AB
Yeast Exam: NEGATIVE

## 2019-08-16 ENCOUNTER — Encounter: Payer: Self-pay | Admitting: Family Medicine

## 2019-08-16 ENCOUNTER — Ambulatory Visit: Payer: Medicaid Other | Admitting: Family Medicine

## 2019-08-16 ENCOUNTER — Other Ambulatory Visit: Payer: Self-pay

## 2019-08-16 VITALS — BP 108/60 | Ht 67.0 in | Wt 147.0 lb

## 2019-08-16 DIAGNOSIS — Z1388 Encounter for screening for disorder due to exposure to contaminants: Secondary | ICD-10-CM | POA: Diagnosis not present

## 2019-08-16 DIAGNOSIS — A599 Trichomoniasis, unspecified: Secondary | ICD-10-CM

## 2019-08-16 DIAGNOSIS — Z3009 Encounter for other general counseling and advice on contraception: Secondary | ICD-10-CM | POA: Diagnosis not present

## 2019-08-16 DIAGNOSIS — Z0389 Encounter for observation for other suspected diseases and conditions ruled out: Secondary | ICD-10-CM | POA: Diagnosis not present

## 2019-08-16 DIAGNOSIS — Z113 Encounter for screening for infections with a predominantly sexual mode of transmission: Secondary | ICD-10-CM

## 2019-08-16 LAB — WET PREP FOR TRICH, YEAST, CLUE
Trichomonas Exam: POSITIVE — AB
Yeast Exam: NEGATIVE

## 2019-08-16 LAB — PREGNANCY, URINE: Preg Test, Ur: NEGATIVE

## 2019-08-16 MED ORDER — METRONIDAZOLE 500 MG PO TABS: 2000.0000 mg | ORAL_TABLET | Freq: Once | ORAL | 0 refills | Status: AC

## 2019-08-16 NOTE — Progress Notes (Signed)
    STI clinic/screening visit  Subjective:  Tammy Frey is a 33 y.o. female being seen today for an STI screening visit. The patient reports they do have symptoms.  Patient has the following medical conditions:  There are no active problems to display for this patient.    No chief complaint on file.  HPI  Patient reports that she has no STD sympts but had 2 periods last months.  She had her IUD removed 04/2019 land she and her partner don't use condoms for birth control.  She requests a pregnancy test today.  See flowsheet for further details and programmatic requirements.    The following portions of the patient's history were reviewed and updated as appropriate: allergies, current medications, past medical history, past social history, past surgical history and problem list.  Objective:   Vitals:   08/16/19 1052  BP: 108/60  Weight: 147 lb (66.7 kg)  Height: 5\' 7"  (1.702 m)    Physical Exam HENT:     Mouth/Throat:     Mouth: Mucous membranes are moist.     Pharynx: No oropharyngeal exudate or posterior oropharyngeal erythema.  Neck:     Musculoskeletal: No muscular tenderness.  Abdominal:     General: There is no distension.     Tenderness: There is no abdominal tenderness.  Genitourinary:    General: Normal vulva.     Vagina: Vaginal discharge present.     Comments: White disch adhers towall, pH 4.5, + fishy odor noted bimaual not indicated Lymphadenopathy:     Cervical: No cervical adenopathy.  Skin:    General: Skin is warm and dry.     Findings: Lesion present. No rash.    Assessment and Plan:  Tammy Frey is a 33 y.o. female presenting to the [facility] Department for STI screening  1. Screening examination for venereal disease  - Pregnancy, urine- negative - WET PREP FOR TRICH, YEAST, CLUE - Chlamydia/Gonorrhea [place] [facility] - HIV/HCV [place] [facility] - Hepatitis Serology, [place] [facility] - Syphilis Serology, [place] [facility]   2.   Trichmoniasis Treat with Metronidazole 2gm po once. Co to have partner make an appt for treatment.  3.  Co on birth control options.  No follow-ups on file.  No future appointments.  Hassell Done, FNP

## 2019-08-16 NOTE — Progress Notes (Signed)
Wants PT. Reports has had 2 periods the month of October.  C/O vaginal itch. Wants STD screen while here.  Declines BC method. Aileen Fass, RN

## 2019-08-16 NOTE — Progress Notes (Signed)
UPT is negative today. Wet mount reviewed with provider and pt treated for Trich per standing order and per Hassell Done, FNP verbal order to treat for Trich only. Provider orders completed.Ronny Bacon, RN

## 2019-08-23 LAB — HEPATITIS B SURFACE ANTIGEN

## 2019-08-25 LAB — HM HIV SCREENING LAB: HM HIV Screening: NEGATIVE

## 2019-08-25 LAB — HM HEPATITIS C SCREENING LAB: HM Hepatitis Screen: NEGATIVE

## 2019-10-07 DIAGNOSIS — R569 Unspecified convulsions: Secondary | ICD-10-CM

## 2019-10-07 HISTORY — DX: Unspecified convulsions: R56.9

## 2019-10-13 DIAGNOSIS — Z5181 Encounter for therapeutic drug level monitoring: Secondary | ICD-10-CM | POA: Diagnosis not present

## 2019-10-14 ENCOUNTER — Other Ambulatory Visit: Payer: Self-pay

## 2019-10-14 ENCOUNTER — Ambulatory Visit: Payer: Medicaid Other

## 2019-10-14 DIAGNOSIS — Z111 Encounter for screening for respiratory tuberculosis: Secondary | ICD-10-CM

## 2019-10-17 ENCOUNTER — Other Ambulatory Visit: Payer: Self-pay

## 2019-10-17 ENCOUNTER — Ambulatory Visit (LOCAL_COMMUNITY_HEALTH_CENTER): Payer: Self-pay

## 2019-10-17 DIAGNOSIS — Z111 Encounter for screening for respiratory tuberculosis: Secondary | ICD-10-CM

## 2019-10-17 LAB — TB SKIN TEST
Induration: 0 mm
TB Skin Test: NEGATIVE

## 2019-10-19 DIAGNOSIS — Z5181 Encounter for therapeutic drug level monitoring: Secondary | ICD-10-CM | POA: Diagnosis not present

## 2019-10-26 DIAGNOSIS — Z5181 Encounter for therapeutic drug level monitoring: Secondary | ICD-10-CM | POA: Diagnosis not present

## 2019-11-02 DIAGNOSIS — Z5181 Encounter for therapeutic drug level monitoring: Secondary | ICD-10-CM | POA: Diagnosis not present

## 2019-11-09 DIAGNOSIS — Z5181 Encounter for therapeutic drug level monitoring: Secondary | ICD-10-CM | POA: Diagnosis not present

## 2019-11-23 DIAGNOSIS — Z5181 Encounter for therapeutic drug level monitoring: Secondary | ICD-10-CM | POA: Diagnosis not present

## 2019-12-21 DIAGNOSIS — Z5181 Encounter for therapeutic drug level monitoring: Secondary | ICD-10-CM | POA: Diagnosis not present

## 2019-12-28 DIAGNOSIS — Z5181 Encounter for therapeutic drug level monitoring: Secondary | ICD-10-CM | POA: Diagnosis not present

## 2020-01-04 DIAGNOSIS — Z5181 Encounter for therapeutic drug level monitoring: Secondary | ICD-10-CM | POA: Diagnosis not present

## 2020-01-11 DIAGNOSIS — Z5181 Encounter for therapeutic drug level monitoring: Secondary | ICD-10-CM | POA: Diagnosis not present

## 2020-01-18 DIAGNOSIS — Z5181 Encounter for therapeutic drug level monitoring: Secondary | ICD-10-CM | POA: Diagnosis not present

## 2020-02-01 DIAGNOSIS — Z5181 Encounter for therapeutic drug level monitoring: Secondary | ICD-10-CM | POA: Diagnosis not present

## 2020-02-08 DIAGNOSIS — Z5181 Encounter for therapeutic drug level monitoring: Secondary | ICD-10-CM | POA: Diagnosis not present

## 2020-02-22 DIAGNOSIS — Z5181 Encounter for therapeutic drug level monitoring: Secondary | ICD-10-CM | POA: Diagnosis not present

## 2020-02-29 DIAGNOSIS — Z5181 Encounter for therapeutic drug level monitoring: Secondary | ICD-10-CM | POA: Diagnosis not present

## 2020-03-06 ENCOUNTER — Emergency Department
Admission: EM | Admit: 2020-03-06 | Discharge: 2020-03-06 | Disposition: A | Discharge status: Home / Self Care | Payer: Medicaid Other | Attending: Emergency Medicine | Admitting: Emergency Medicine

## 2020-03-06 ENCOUNTER — Other Ambulatory Visit: Payer: Self-pay

## 2020-03-06 ENCOUNTER — Emergency Department: Payer: Medicaid Other

## 2020-03-06 DIAGNOSIS — O209 Hemorrhage in early pregnancy, unspecified: Secondary | ICD-10-CM | POA: Insufficient documentation

## 2020-03-06 DIAGNOSIS — Z3A01 Less than 8 weeks gestation of pregnancy: Secondary | ICD-10-CM | POA: Insufficient documentation

## 2020-03-06 DIAGNOSIS — F17200 Nicotine dependence, unspecified, uncomplicated: Secondary | ICD-10-CM | POA: Insufficient documentation

## 2020-03-06 DIAGNOSIS — R1032 Left lower quadrant pain: Secondary | ICD-10-CM | POA: Diagnosis not present

## 2020-03-06 DIAGNOSIS — N939 Abnormal uterine and vaginal bleeding, unspecified: Secondary | ICD-10-CM

## 2020-03-06 DIAGNOSIS — O208 Other hemorrhage in early pregnancy: Secondary | ICD-10-CM | POA: Diagnosis not present

## 2020-03-06 LAB — BASIC METABOLIC PANEL
Anion gap: 7 (ref 5–15)
BUN: 10 mg/dL (ref 6–20)
CO2: 23 mmol/L (ref 22–32)
Calcium: 8.7 mg/dL — ABNORMAL LOW (ref 8.9–10.3)
Chloride: 104 mmol/L (ref 98–111)
Creatinine, Ser: 0.7 mg/dL (ref 0.44–1.00)
GFR calc Af Amer: >60 mL/min (ref >60)
GFR calc non Af Amer: >60 mL/min (ref >60)
Glucose, Bld: 101 mg/dL — ABNORMAL HIGH (ref 70–99)
Potassium: 3.4 mmol/L — ABNORMAL LOW (ref 3.5–5.1)
Sodium: 134 mmol/L — ABNORMAL LOW (ref 135–145)

## 2020-03-06 LAB — CBC
HCT: 30.3 % — ABNORMAL LOW (ref 36.0–46.0)
Hemoglobin: 10.1 g/dL — ABNORMAL LOW (ref 12.0–15.0)
MCH: 29.9 pg (ref 26.0–34.0)
MCHC: 33.3 g/dL (ref 30.0–36.0)
MCV: 89.6 fL (ref 80.0–100.0)
Platelets: 189 10*3/uL (ref 150–400)
RBC: 3.38 MIL/uL — ABNORMAL LOW (ref 3.87–5.11)
RDW: 12.6 % (ref 11.5–15.5)
WBC: 6.2 10*3/uL (ref 4.0–10.5)
nRBC: 0 % (ref 0.0–0.2)

## 2020-03-06 LAB — POCT PREGNANCY, URINE: Preg Test, Ur: POSITIVE — AB

## 2020-03-06 LAB — HCG, QUANTITATIVE, PREGNANCY: hCG, Beta Chain, Quant, S: 83243 m[IU]/mL — ABNORMAL HIGH (ref <5)

## 2020-03-06 LAB — ABO/RH: ABO/RH(D): O POS

## 2020-03-06 NOTE — ED Triage Notes (Signed)
Pt c/o "spotting" vaginal bleeding through out the month of April and may. States she took a home pregnancy test that was positive.

## 2020-03-06 NOTE — Discharge Instructions (Addendum)
Follow-up with your regular doctor.  Return emergency department if worsening abdominal pain or heavier vaginal bleeding.  take  prenatal pills.

## 2020-03-06 NOTE — ED Provider Notes (Signed)
[facility] Emergency Department Provider Note  ____________________________________________   First MD Initiated Contact with Patient 03/06/20 1019     (approximate)  I have reviewed the triage vital signs and the nursing notes.   HISTORY  Chief Complaint Vaginal Bleeding    HPI Tammy Frey is a 34 y.o. female presents emergency department complaint of spotting and vaginal bleeding through the months of April and May.  Home pregnancy test was positive.   Patient is complaining of left lower quadrant pain.  Also feels more tired than normal.   Past Medical History:  Diagnosis Date  . Seizures ([place])     There are no problems to display for this patient.   History reviewed. No pertinent surgical history.  Prior to Admission medications   Not on File    Allergies Haldol [haloperidol]  No family history on file.  Social History Social History   Tobacco Use  . Smoking status: Current Every Day Smoker  . Smokeless tobacco: Never Used  Substance Use Topics  . Alcohol use: Yes    Comment: occasionally  . Drug use: Yes    Types: Marijuana    Review of Systems  Constitutional: No fever/chills Eyes: No visual changes. ENT: No sore throat. Respiratory: Denies cough Cardiovascular: Denies chest pain Gastrointestinal: Denies abdominal pain Genitourinary: Negative for dysuria.  Positive for vaginal bleeding Musculoskeletal: Negative for back pain. Skin: Negative for rash. Psychiatric: no mood changes,     ____________________________________________   PHYSICAL EXAM:  VITAL SIGNS: ED Triage Vitals  Enc Vitals Group     BP 03/06/20 0949 124/64     Pulse Rate 03/06/20 0949 64     Resp 03/06/20 0949 17     Temp 03/06/20 0949 98.5 F (36.9 C)     Temp Source 03/06/20 0949 Oral     SpO2 03/06/20 0949 99 %     Weight 03/06/20 0953 148 lb (67.1 kg)     Height 03/06/20 0953 5\' 6"  (1.676 m)     Head Circumference --    Peak Flow --      Pain Score 03/06/20 0952 4     Pain Loc --      Pain Edu? --      Excl. in [place]? --     Constitutional: Alert and oriented. Well appearing and in no acute distress. Eyes: Conjunctivae are normal.  Head: Atraumatic. Nose: No congestion/rhinnorhea. Mouth/Throat: Mucous membranes are moist.   Neck:  supple no lymphadenopathy noted Cardiovascular: Normal rate, regular rhythm.  Respiratory: Normal respiratory effort.  No retractions,  Abd: soft tender in the left lower quadrant, bs normal all 4 quad GU: deferred by patient Musculoskeletal: FROM all extremities, warm and well perfused Neurologic:  Normal speech and language.  Skin:  Skin is warm, dry and intact. No rash noted. Psychiatric: Mood and affect are normal. Speech and behavior are normal.  ____________________________________________   LABS (all labs ordered are listed, but only abnormal results are displayed)  Labs Reviewed  BASIC METABOLIC PANEL - Abnormal; Notable for the following components:      Result Value   Sodium 134 (*)    Potassium 3.4 (*)    Glucose, Bld 101 (*)    Calcium 8.7 (*)    All other components within normal limits  CBC - Abnormal; Notable for the following components:   RBC 3.38 (*)    Hemoglobin 10.1 (*)    HCT 30.3 (*)    All other components  within normal limits  HCG, QUANTITATIVE, PREGNANCY - Abnormal; Notable for the following components:   hCG, Beta Chain, Quant, S 83,243 (*)    All other components within normal limits  POCT PREGNANCY, URINE - Abnormal; Notable for the following components:   Preg Test, Ur POSITIVE (*)    All other components within normal limits  POC URINE PREG, ED  ABO/RH   ____________________________________________   ____________________________________________  RADIOLOGY  Ultrasound OB less than 14 weeks with transvaginal shows an IUP of 7 weeks  ____________________________________________   PROCEDURES  Procedure(s) performed: No   Procedures    ____________________________________________   INITIAL IMPRESSION / ASSESSMENT AND PLAN / ED COURSE  Pertinent labs & imaging results that were available during my care of the patient were reviewed by me and considered in my medical decision making (see chart for details).   Patient is 34 year old female presents emergency department with left lower quadrant pain and vaginal bleeding with positive pregnancy test.  See HPI  Physical exam shows patient to appear well.  Vitals normal.  Left lower quadrant is tender to palpation.  DDx: Ectopic, threatened miscarriage, subchorionic bleed  CBC has decreased H&H of 10.1/30.3, POC pregnancy is positive, ABO/Rh is O+, basic metabolic panel is fairly normal with no red flags or concerns, beta hCG is 83,243  Ultrasound OB less than 14 weeks with transvaginal ordered  ----------------------------------------- 12:40 PM on 03/06/2020 -----------------------------------------  Patient was notified that the ultrasound did show a IUP of 7 weeks.  She is to follow-up with OB/GYN, she states she has an appointment at [facility] on Thursday.  Return if worsening.  Take over-the-counter prenatal vitamins.  She was given a work note and discharged stable condition.    Tammy Frey was evaluated in Emergency Department on 03/06/2020 for the symptoms described in the history of present illness. She was evaluated in the context of the global COVID-19 pandemic, which necessitated consideration that the patient might be at risk for infection with the SARS-CoV-2 virus that causes COVID-19. Institutional protocols and algorithms that pertain to the evaluation of patients at risk for COVID-19 are in a state of rapid change based on information released by regulatory bodies including the CDC and federal and state organizations. These policies and algorithms were followed during the patient's care in the ED.   As part of my medical decision making,  I reviewed the following data within the electronic MEDICAL RECORD NUMBER Nursing notes reviewed and incorporated, Labs reviewed , Old chart reviewed, Radiograph reviewed , Notes from prior ED visits and [place] Controlled Substance Database  ____________________________________________   FINAL CLINICAL IMPRESSION(S) / ED DIAGNOSES  Final diagnoses:  Vaginal bleeding  Vaginal bleeding in pregnancy, first trimester      NEW MEDICATIONS STARTED DURING THIS VISIT:  New Prescriptions   No medications on file     Note:  This document was prepared using Dragon voice recognition software and may include unintentional dictation errors.    Faythe Ghee, PA-C 03/06/20 1241    Arnaldo Natal, MD 03/06/20 1600

## 2020-03-06 NOTE — ED Triage Notes (Signed)
First nurse note- found out Sunday was pregnant. Now having bleeding.  Normal period may and April.

## 2020-03-07 DIAGNOSIS — Z5181 Encounter for therapeutic drug level monitoring: Secondary | ICD-10-CM | POA: Diagnosis not present

## 2020-03-08 ENCOUNTER — Encounter: Payer: Self-pay | Admitting: Advanced Practice Midwife

## 2020-03-08 ENCOUNTER — Other Ambulatory Visit: Payer: Self-pay

## 2020-03-08 ENCOUNTER — Ambulatory Visit (INDEPENDENT_AMBULATORY_CARE_PROVIDER_SITE_OTHER): Payer: Medicaid Other | Admitting: Advanced Practice Midwife

## 2020-03-08 ENCOUNTER — Other Ambulatory Visit (HOSPITAL_COMMUNITY)
Admission: RE | Admit: 2020-03-08 | Discharge: 2020-03-08 | Disposition: A | Discharge status: Home / Self Care | Payer: Medicaid Other | Source: Ambulatory Visit | Attending: Advanced Practice Midwife | Admitting: Advanced Practice Midwife

## 2020-03-08 VITALS — BP 122/79 | HR 70 | Wt 144.0 lb

## 2020-03-08 DIAGNOSIS — Z113 Encounter for screening for infections with a predominantly sexual mode of transmission: Secondary | ICD-10-CM | POA: Insufficient documentation

## 2020-03-08 DIAGNOSIS — Z124 Encounter for screening for malignant neoplasm of cervix: Secondary | ICD-10-CM

## 2020-03-08 DIAGNOSIS — Z3481 Encounter for supervision of other normal pregnancy, first trimester: Secondary | ICD-10-CM

## 2020-03-08 NOTE — Patient Instructions (Signed)
Exercise During Pregnancy Exercise is an important part of being healthy for people of all ages. Exercise improves the function of your heart and lungs and helps you maintain strength, flexibility, and a healthy body weight. Exercise also boosts energy levels and elevates mood. Most women should exercise regularly during pregnancy. In rare cases, women with certain medical conditions or complications may be asked to limit or avoid exercise during pregnancy. How does this affect me? Along with maintaining general strength and flexibility, exercising during pregnancy can help:  Keep strength in muscles that are used during labor and childbirth.  Decrease low back pain.  Reduce symptoms of depression.  Control weight gain during pregnancy.  Reduce the risk of needing insulin if you develop diabetes during pregnancy.  Decrease the risk of cesarean delivery.  Speed up your recovery after giving birth. How does this affect my baby? Exercise can help you have a healthy pregnancy. Exercise does not cause premature birth. It will not cause your baby to weigh less at birth. What exercises can I do? Many exercises are safe for you to do during pregnancy. Do a variety of exercises that safely increase your heart and breathing rates and help you build and maintain muscle strength. Do exercises exactly as told by your health care provider. You may do these exercises:  Walking or hiking.  Swimming.  Water aerobics.  Riding a stationary bike.  Strength training.  Modified yoga or Pilates. Tell your instructor that you are pregnant. Avoid overstretching, and avoid lying on your back for long periods of time.  Running or jogging. Only choose this type of exercise if you: ? Ran or jogged regularly before your pregnancy. ? Can run or jog and still talk in complete sentences. What exercises should I avoid? Depending on your level of fitness and whether you exercised regularly before your  pregnancy, you may be told to limit high-intensity exercise. You can tell that you are exercising at a high intensity if you are breathing much harder and faster and cannot hold a conversation while exercising. You must avoid:  Contact sports.  Activities that put you at risk for falling on or being hit in the belly, such as downhill skiing, water skiing, surfing, rock climbing, cycling, gymnastics, and horseback riding.  Scuba diving.  Skydiving.  Yoga or Pilates in a room that is heated to high temperatures.  Jogging or running, unless you ran or jogged regularly before your pregnancy. While jogging or running, you should always be able to talk in full sentences. Do not run or jog so fast that you are unable to have a conversation.  Do not exercise at more than 6,000 feet above sea level (high elevation) if you are not used to exercising at high elevation. How do I exercise in a safe way?   Avoid overheating. Do not exercise in very high temperatures.  Wear loose-fitting, breathable clothes.  Avoid dehydration. Drink enough water before, during, and after exercise to keep your urine pale yellow.  Avoid overstretching. Because of hormone changes during pregnancy, it is easy to overstretch muscles, tendons, and ligaments during pregnancy.  Start slowly and ask your health care provider to recommend the types of exercise that are safe for you.  Do not exercise to lose weight. Follow these instructions at home:  Exercise on most days or all days of the week. Try to exercise for 30 minutes a day, 5 days a week, unless your health care provider tells you not to.  If   you actively exercised before your pregnancy and you are healthy, your health care provider may tell you to continue to do moderate to high-intensity exercise.  If you are just starting to exercise or did not exercise much before your pregnancy, your health care provider may tell you to do low to moderate-intensity  exercise. Questions to ask your health care provider  Is exercise safe for me?  What are signs that I should stop exercising?  Does my health condition mean that I should not exercise during pregnancy?  When should I avoid exercising during pregnancy? Stop exercising and contact a health care provider if: You have any unusual symptoms, such as:  Mild contractions of the uterus or cramps in the abdomen.  Dizziness that does not go away when you rest. Stop exercising and get help right away if: You have any unusual symptoms, such as:  Sudden, severe pain in your low back or your belly.  Mild contractions of the uterus or cramps in the abdomen that do not improve with rest and drinking fluids.  Chest pain.  Bleeding or fluid leaking from your vagina.  Shortness of breath. These symptoms may represent a serious problem that is an emergency. Do not wait to see if the symptoms will go away. Get medical help right away. Call your local emergency services (911 in the [place] Do not drive yourself to the hospital. Summary  Most women should exercise regularly throughout pregnancy. In rare cases, women with certain medical conditions or complications may be asked to limit or avoid exercise during pregnancy.  Do not exercise to lose weight during pregnancy.  Your health care provider will tell you what level of physical activity is right for you.  Stop exercising and contact a health care provider if you have mild contractions of the uterus or cramps in the abdomen. Get help right away if these contractions or cramps do not improve with rest and drinking fluids.  Stop exercising and get help right away if you have sudden, severe pain in your low back or belly, chest pain, shortness of breath, or bleeding or leaking of fluid from your vagina. This information is not intended to replace advice given to you by your health care provider. Make sure you discuss any questions you have with your  health care provider. Document Revised: 01/13/2019 Document Reviewed: 10/27/2018 Elsevier Patient Education  2020 [employer] Eating Plan for Pregnant Women While you are pregnant, your body requires additional nutrition to help support your growing baby. You also have a higher need for some vitamins and minerals, such as folic acid, calcium, iron, and vitamin D. Eating a healthy, well-balanced diet is very important for your health and your baby's health. Your need for extra calories varies for the three 3-month segments of your pregnancy (trimesters). For most women, it is recommended to consume:  150 extra calories a day during the first trimester.  300 extra calories a day during the second trimester.  300 extra calories a day during the third trimester. What are tips for following this plan?   Do not try to lose weight or go on a diet during pregnancy.  Limit your overall intake of foods that have "empty calories." These are foods that have little nutritional value, such as sweets, desserts, candies, and sugar-sweetened beverages.  Eat a variety of foods (especially fruits and vegetables) to get a full range of vitamins and minerals.  Take a prenatal vitamin to help meet your additional vitamin and mineral needs   during pregnancy, specifically for folic acid, iron, calcium, and vitamin D.  Remember to stay active. Ask your health care provider what types of exercise and activities are safe for you.  Practice good food safety and cleanliness. Wash your hands before you eat and after you prepare raw meat. Wash all fruits and vegetables well before peeling or eating. Taking these actions can help to prevent food-borne illnesses that can be very dangerous to your baby, such as listeriosis. Ask your health care provider for more information about listeriosis. What does 150 extra calories look like? Healthy options that provide 150 extra calories each day could be any of the  following:  6-8 oz (170-230 g) of plain low-fat yogurt with  cup of berries.  1 apple with 2 teaspoons (11 g) of peanut butter.  Cut-up vegetables with  cup (60 g) of hummus.  8 oz (230 mL) or 1 cup of low-fat chocolate milk.  1 stick of string cheese with 1 medium orange.  1 peanut butter and jelly sandwich that is made with one slice of whole-wheat bread and 1 tsp (5 g) of peanut butter. For 300 extra calories, you could eat two of those healthy options each day. What is a healthy amount of weight to gain? The right amount of weight gain for you is based on your BMI before you became pregnant. If your BMI:  Was less than 18 (underweight), you should gain 28-40 lb (13-18 kg).  Was 18-24.9 (normal), you should gain 25-35 lb (11-16 kg).  Was 25-29.9 (overweight), you should gain 15-25 lb (7-11 kg).  Was 30 or greater (obese), you should gain 11-20 lb (5-9 kg). What if I am having twins or multiples? Generally, if you are carrying twins or multiples:  You may need to eat 300-600 extra calories a day.  The recommended range for total weight gain is 25-54 lb (11-25 kg), depending on your BMI before pregnancy.  Talk with your health care provider to find out about nutritional needs, weight gain, and exercise that is right for you. What foods can I eat?  Fruits All fruits. Eat a variety of colors and types of fruit. Remember to wash your fruits well before peeling or eating. Vegetables All vegetables. Eat a variety of colors and types of vegetables. Remember to wash your vegetables well before peeling or eating. Grains All grains. Choose whole grains, such as whole-wheat bread, oatmeal, or brown rice. Meats and other protein foods Lean meats, including chicken, turkey, fish, and lean cuts of beef, veal, or pork. If you eat fish or seafood, choose options that are higher in omega-3 fatty acids and lower in mercury, such as salmon, herring, mussels, trout, sardines, pollock,  shrimp, crab, and lobster. Tofu. Tempeh. Beans. Eggs. Peanut butter and other nut butters. Make sure that all meats, poultry, and eggs are cooked to food-safe temperatures or "well-done." Two or more servings of fish are recommended each week in order to get the most benefits from omega-3 fatty acids that are found in seafood. Choose fish that are lower in mercury. You can find more information online:  www.fda.gov Dairy Pasteurized milk and milk alternatives (such as almond milk). Pasteurized yogurt and pasteurized cheese. Cottage cheese. Sour cream. Beverages Water. Juices that contain 100% fruit juice or vegetable juice. Caffeine-free teas and decaffeinated coffee. Drinks that contain caffeine are okay to drink, but it is better to avoid caffeine. Keep your total caffeine intake to less than 200 mg each day (which is 12 oz   or 355 mL of coffee, tea, or soda) or the limit as told by your health care provider. Fats and oils Fats and oils are okay to include in moderation. Sweets and desserts Sweets and desserts are okay to include in moderation. Seasoning and other foods All pasteurized condiments. The items listed above may not be a complete list of foods and beverages you can eat. Contact a dietitian for more information. What foods are not recommended? Fruits Unpasteurized fruit juices. Vegetables Raw (unpasteurized) vegetable juices. Meats and other protein foods Lunch meats, bologna, hot dogs, or other deli meats. (If you must eat those meats, reheat them until they are steaming hot.) Refrigerated pat, meat spreads from a meat counter, smoked seafood that is found in the refrigerated section of a store. Raw or undercooked meats, poultry, and eggs. Raw fish, such as sushi or sashimi. Fish that have high mercury content, such as tilefish, shark, swordfish, and king mackerel. To learn more about mercury in fish, talk with your health care provider or look for online resources, such  as:  www.fda.gov Dairy Raw (unpasteurized) milk and any foods that have raw milk in them. Soft cheeses, such as feta, queso blanco, queso fresco, Brie, Camembert cheeses, blue-veined cheeses, and Panela cheese (unless it is made with pasteurized milk, which must be stated on the label). Beverages Alcohol. Sugar-sweetened beverages, such as sodas, teas, or energy drinks. Seasoning and other foods Homemade fermented foods and drinks, such as pickles, sauerkraut, or kombucha drinks. (Store-bought pasteurized versions of these are okay.) Salads that are made in a store or deli, such as ham salad, chicken salad, egg salad, tuna salad, and seafood salad. The items listed above may not be a complete list of foods and beverages you should avoid. Contact a dietitian for more information. Where to find more information To calculate the number of calories you need based on your height, weight, and activity level, you can use an online calculator such as:  www.choosemyplate.gov/MyPlatePlan To calculate how much weight you should gain during pregnancy, you can use an online pregnancy weight gain calculator such as:  www.choosemyplate.gov/pregnancy-weight-gain-calculator Summary  While you are pregnant, your body requires additional nutrition to help support your growing baby.  Eat a variety of foods, especially fruits and vegetables to get a full range of vitamins and minerals.  Practice good food safety and cleanliness. Wash your hands before you eat and after you prepare raw meat. Wash all fruits and vegetables well before peeling or eating. Taking these actions can help to prevent food-borne illnesses, such as listeriosis, that can be very dangerous to your baby.  Do not eat raw meat or fish. Do not eat fish that have high mercury content, such as tilefish, shark, swordfish, and king mackerel. Do not eat unpasteurized (raw) dairy.  Take a prenatal vitamin to help meet your additional vitamin and  mineral needs during pregnancy, specifically for folic acid, iron, calcium, and vitamin D. This information is not intended to replace advice given to you by your health care provider. Make sure you discuss any questions you have with your health care provider. Document Revised: 02/10/2019 Document Reviewed: 06/19/2017 Elsevier Patient Education  2020 [employer] Prenatal Care Prenatal care is health care during pregnancy. It helps you and your unborn baby (fetus) stay as healthy as possible. Prenatal care may be provided by a midwife, a family practice health care provider, or a childbirth and pregnancy specialist (obstetrician). How does this affect me? During pregnancy, you will be closely monitored   for any new conditions that might develop. To lower your risk of pregnancy complications, you and your health care provider will talk about any underlying conditions you have. How does this affect my baby? Early and consistent prenatal care increases the chance that your baby will be healthy during pregnancy. Prenatal care lowers the risk that your baby will be:  Born early (prematurely).  Smaller than expected at birth (small for gestational age). What can I expect at the first prenatal care visit? Your first prenatal care visit will likely be the longest. You should schedule your first prenatal care visit as soon as you know that you are pregnant. Your first visit is a good time to talk about any questions or concerns you have about pregnancy. At your visit, you and your health care provider will talk about:  Your medical history, including: ? Any past pregnancies. ? Your family's medical history. ? The baby's father's medical history. ? Any long-term (chronic) health conditions you have and how you manage them. ? Any surgeries or procedures you have had. ? Any current over-the-counter or prescription medicines, herbs, or supplements you are taking.  Other factors that could pose a risk  to your baby, including:  Your home setting and your stress levels, including: ? Exposure to abuse or violence. ? Household financial strain. ? Mental health conditions you have.  Your daily health habits, including diet and exercise. Your health care provider will also:  Measure your weight, height, and blood pressure.  Do a physical exam, including a pelvic and breast exam.  Perform blood tests and urine tests to check for: ? Urinary tract infection. ? Sexually transmitted infections (STIs). ? Low iron levels in your blood (anemia). ? Blood type and certain proteins on red blood cells (Rh antibodies). ? Infections and immunity to viruses, such as hepatitis B and rubella. ? HIV (human immunodeficiency virus).  Do an ultrasound to confirm your baby's growth and development and to help predict your estimated due date (EDD). This ultrasound is done with a probe that is inserted into the vagina (transvaginal ultrasound).  Discuss your options for genetic screening.  Give you information about how to keep yourself and your baby healthy, including: ? Nutrition and taking vitamins. ? Physical activity. ? How to manage pregnancy symptoms such as nausea and vomiting (morning sickness). ? Infections and substances that may be harmful to your baby and how to avoid them. ? Food safety. ? Dental care. ? Working. ? Travel. ? Warning signs to watch for and when to call your health care provider. How often will I have prenatal care visits? After your first prenatal care visit, you will have regular visits throughout your pregnancy. The visit schedule is often as follows:  Up to week 28 of pregnancy: once every 4 weeks.  28-36 weeks: once every 2 weeks.  After 36 weeks: every week until delivery. Some women may have visits more or less often depending on any underlying health conditions and the health of the baby. Keep all follow-up and prenatal care visits as told by your health care  provider. This is important. What happens during routine prenatal care visits? Your health care provider will:  Measure your weight and blood pressure.  Check for fetal heart sounds.  Measure the height of your uterus in your abdomen (fundal height). This may be measured starting around week 20 of pregnancy.  Check the position of your baby inside your uterus.  Ask questions about your diet, sleeping patterns, and   whether you can feel the baby move.  Review warning signs to watch for and signs of labor.  Ask about any pregnancy symptoms you are having and how you are dealing with them. Symptoms may include: ? Headaches. ? Nausea and vomiting. ? Vaginal discharge. ? Swelling. ? Fatigue. ? Constipation. ? Any discomfort, including back or pelvic pain. Make a list of questions to ask your health care provider at your routine visits. What tests might I have during prenatal care visits? You may have blood, urine, and imaging tests throughout your pregnancy, such as:  Urine tests to check for glucose, protein, or signs of infection.  Glucose tests to check for a form of diabetes that can develop during pregnancy (gestational diabetes mellitus). This is usually done around week 24 of pregnancy.  An ultrasound to check your baby's growth and development and to check for birth defects. This is usually done around week 20 of pregnancy.  A test to check for group B strep (GBS) infection. This is usually done around week 36 of pregnancy.  Genetic testing. This may include blood or imaging tests, such as an ultrasound. Some genetic tests are done during the first trimester and some are done during the second trimester. What else can I expect during prenatal care visits? Your health care provider may recommend getting certain vaccines during pregnancy. These may include:  A yearly flu shot (annual influenza vaccine). This is especially important if you will be pregnant during flu  season.  Tdap (tetanus, diphtheria, pertussis) vaccine. Getting this vaccine during pregnancy can protect your baby from whooping cough (pertussis) after birth. This vaccine may be recommended between weeks 27 and 36 of pregnancy. Later in your pregnancy, your health care provider may give you information about:  Childbirth and breastfeeding classes.  Choosing a health care provider for your baby.  Umbilical cord banking.  Breastfeeding.  Birth control after your baby is born.  The hospital labor and delivery unit and how to tour it.  Registering at the hospital before you go into labor. Where to find more information  Office on Women's Health: womenshealth.gov  American Pregnancy Association: americanpregnancy.org  March of Dimes: marchofdimes.org Summary  Prenatal care helps you and your baby stay as healthy as possible during pregnancy.  Your first prenatal care visit will most likely be the longest.  You will have visits and tests throughout your pregnancy to monitor your health and your baby's health.  Bring a list of questions to your visits to ask your health care provider.  Make sure to keep all follow-up and prenatal care visits with your health care provider. This information is not intended to replace advice given to you by your health care provider. Make sure you discuss any questions you have with your health care provider. Document Revised: 01/12/2019 Document Reviewed: 09/21/2017 Elsevier Patient Education  2020 [employer]  

## 2020-03-08 NOTE — Progress Notes (Addendum)
New Obstetric Patient H&P  Date of Service: 03/08/2020  Chief Complaint: "Desires prenatal care"   History of Present Illness: Patient is a 34 y.o. G62P1011 Not Hispanic or Latino female, presents with amenorrhea and positive home pregnancy test. No LMP recorded (lmp unknown). Patient is pregnant. and based on ultrasound 2 days ago, her EDD is Estimated Date of Delivery: 10/20/20 and her EGA is [redacted]w[redacted]d. Cycles are 5 days, regular, and occur approximately every : 28 days. Her last pap smear was unknown date. She reports negative PAP history. PAP today.    She had a urine pregnancy test which was positive 2 day(s)  ago. Her last menstrual period was normal and lasted for  6 day(s). Since her LMP she claims she has experienced breast tenderness, fatigue, nausea, vomiting. She has had spotting vaginal bleeding during April and May. She was seen in the ER 2 days ago and found to have viable [redacted]w[redacted]d IUP. No bleeding today. 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 Her prior pregnancies are notable for G1 2006 TAB, G2 2009 FT SVD female 7 pounds 4 ounces, G3 2013 TAB.  Since her LMP, she admits to the use of tobacco products  She was smoking 1.5 ppd and is now smoking 1/3 ppd and is trying to quit She claims she has lost  4 pounds since the start of her pregnancy.  There are cats in the home in the home  no  She admits close contact with children on a regular basis  yes  She has had chicken pox in the past no She has had Tuberculosis exposures, symptoms, or previously tested positive for TB   no Current or past history of domestic violence. no  Genetic Screening/Teratology Counseling: (Includes patient, baby's father, or anyone in either family with:)   1. Patient's age >/= 71 at [facility]  no 2. Thalassemia ([place], [place], Mediterranean, or Asian background): MCV<80  no 3. Neural tube defect (meningomyelocele, spina bifida, anencephaly)  no 4. Congenital heart  defect  no  5. Down syndrome  no 6. Tay-Sachs (Jewish, [place]  no 7. Canavan's Disease  no 8. Sickle cell disease or trait (African)  no  9. Hemophilia or other blood disorders  no  10. Muscular dystrophy  no  11. Cystic fibrosis  no  12. Huntington's Chorea  no  13. Mental retardation/autism  no 14. Other inherited genetic or chromosomal disorder  no 15. Maternal metabolic disorder (DM, PKU, etc)  no 16. Patient or FOB with a child with a birth defect not listed above  no 16a. 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 Recurrent pregnancy loss, or stillbirth  no  18. Any medications since LMP other than prenatal vitamins (include vitamins, supplements, OTC meds, drugs, alcohol)  was using MJ and has quit 19. Any other genetic/environmental exposure to discuss  no  Infection History:   1. Lives with someone with TB or TB exposed  no  2. Patient or partner has history of genital herpes  no 3. Rash or viral illness since LMP  no 4. History of STI (GC, CT, HPV, syphilis, HIV)  Past Trichomonas/Chlamydia infections 5. History of recent travel :  no  Other pertinent information:  no     Review of Systems:10 point review of systems negative unless otherwise noted in HPI  Past Medical History:  There are no problems to display for this patient.  Past Surgical History:  History reviewed. No pertinent surgical history.  Gynecologic History: No LMP recorded (lmp unknown). Patient is pregnant.  Obstetric History: G3P1011  Family History:  History reviewed. No pertinent family history.  Social History:  Social History   Socioeconomic History  . Marital status: Single    Spouse name: Not on file  . Number of children: Not on file  . Years of education: Not on file  . Highest education level: Not on file  Occupational History  . Not on file  Tobacco Use  . Smoking status: Current Every Day Smoker  .  Smokeless tobacco: Never Used  Substance and Sexual Activity  . Alcohol use: Yes    Comment: occasionally  . Drug use: Yes    Types: Marijuana  . Sexual activity: Yes    Birth control/protection: None  Other Topics Concern  . Not on file  Social History Narrative  . Not on file   Social Determinants of Health   Financial Resource Strain:   . Difficulty of Paying Living Expenses:   Food Insecurity:   . Worried About Programme researcher, broadcasting/film/video in the Last Year:   . Barista in the Last Year:   Transportation Needs:   . Freight forwarder (Medical):   Marland Kitchen Lack of Transportation (Non-Medical):   Physical Activity:   . Days of Exercise per Week:   . Minutes of Exercise per Session:   Stress:   . Feeling of Stress :   Social Connections:   . Frequency of Communication with Friends and Family:   . Frequency of Social Gatherings with Friends and Family:   . Attends Religious Services:   . Active Member of Clubs or Organizations:   . Attends Banker Meetings:   Marland Kitchen Marital Status:   Intimate Partner Violence:   . Fear of Current or Ex-Partner:   . Emotionally Abused:   Marland Kitchen Physically Abused:   . Sexually Abused:     Allergies:  Allergies  Allergen Reactions  . Haldol [Haloperidol]     Dystonic reaction    Medications: Prior to Admission medications   Not on File    Physical Exam Vitals: Blood pressure 122/79, pulse 70, weight 144 lb (65.3 kg), last menstrual period unknown.  General: NAD HEENT: normocephalic, anicteric Thyroid: no enlargement, no palpable nodules Pulmonary: No increased work of breathing, CTAB Cardiovascular: RRR, distal pulses 2+ Abdomen: NABS, soft, non-tender, non-distended.  Umbilicus without lesions.  No hepatomegaly, splenomegaly or masses palpable. No evidence of hernia  Genitourinary:  External: Normal external female genitalia.  Normal urethral meatus, normal  Bartholin's and Skene's glands.    Vagina: Normal vaginal mucosa,  no evidence of prolapse.    Cervix: Grossly normal in appearance, friable with exam, no CMT  Uterus:  Non-enlarged, mobile, normal contour.    Adnexa: ovaries non-enlarged, no adnexal masses  Rectal: deferred Extremities: no edema, erythema, or tenderness Neurologic: Grossly intact Psychiatric: mood appropriate, affect full  The following were addressed during this visit:  Breastfeeding Education - Individualized Education    Comments: Contraindications to breastfeeding and other special medical conditions Patient plans to formula feed and declines breastfeeding education    Assessment: 34 y.o. G3P1011 at [redacted]w[redacted]d presenting to initiate prenatal care  Plan: 1) Avoid alcoholic beverages. 2) Patient encouraged not to smoke.  3) Discontinue the use of all non-medicinal drugs and chemicals.  4) Take prenatal vitamins daily.  5) Nutrition, food safety (fish, cheese advisories, and high nitrite  foods) and exercise discussed. 6) Hospital and practice style discussed with cross coverage system.  7) Genetic Screening, such as with 1st Trimester Screening, cell free fetal DNA, AFP testing, and Ultrasound, as well as with amniocentesis and CVS as appropriate, is discussed with patient. At the conclusion of today's visit patient requested cell free DNA and MSAFP genetic testing 8) Patient is asked about travel to areas at risk for the Zika virus, and counseled to avoid travel and exposure to mosquitoes or sexual partners who may have themselves been exposed to the virus. Testing is discussed, and will be ordered as appropriate.  9) Urine culture, PAPtima today 10) Return in 4 weeks for NOB panel, sickle cell screen, [facility] [place], [facility] 03/09/2020, 8:48 AM

## 2020-03-08 NOTE — Progress Notes (Signed)
NOB ER for bleeding 6/1 Ready, Set Baby was given

## 2020-03-09 ENCOUNTER — Encounter: Payer: Self-pay | Admitting: Advanced Practice Midwife

## 2020-03-09 DIAGNOSIS — O0990 Supervision of high risk pregnancy, unspecified, unspecified trimester: Secondary | ICD-10-CM | POA: Insufficient documentation

## 2020-03-09 DIAGNOSIS — O09893 Supervision of other high risk pregnancies, third trimester: Secondary | ICD-10-CM | POA: Insufficient documentation

## 2020-03-10 LAB — URINE CULTURE

## 2020-03-13 LAB — CYTOLOGY - PAP
Chlamydia: NEGATIVE
Comment: NEGATIVE
Comment: NEGATIVE
Comment: NEGATIVE
Comment: NORMAL
Diagnosis: NEGATIVE
High risk HPV: NEGATIVE
Neisseria Gonorrhea: NEGATIVE
Trichomonas: NEGATIVE

## 2020-03-14 ENCOUNTER — Telehealth: Payer: Self-pay

## 2020-03-14 DIAGNOSIS — Z5181 Encounter for therapeutic drug level monitoring: Secondary | ICD-10-CM | POA: Diagnosis not present

## 2020-03-14 NOTE — Telephone Encounter (Signed)
Patient states her work needs a note stating her restrictions. [phone]

## 2020-03-14 NOTE — Telephone Encounter (Signed)
Spoke w/patient. Assisted w/my chart sign up. Letter created>sent.

## 2020-03-21 DIAGNOSIS — Z5181 Encounter for therapeutic drug level monitoring: Secondary | ICD-10-CM | POA: Diagnosis not present

## 2020-03-28 DIAGNOSIS — Z5181 Encounter for therapeutic drug level monitoring: Secondary | ICD-10-CM | POA: Diagnosis not present

## 2020-04-03 DIAGNOSIS — Z5181 Encounter for therapeutic drug level monitoring: Secondary | ICD-10-CM | POA: Diagnosis not present

## 2020-04-05 ENCOUNTER — Ambulatory Visit (INDEPENDENT_AMBULATORY_CARE_PROVIDER_SITE_OTHER): Payer: Medicaid Other | Admitting: Obstetrics and Gynecology

## 2020-04-05 ENCOUNTER — Other Ambulatory Visit: Payer: Self-pay

## 2020-04-05 ENCOUNTER — Encounter: Payer: Self-pay | Admitting: Obstetrics and Gynecology

## 2020-04-05 VITALS — BP 130/72 | Wt 144.0 lb

## 2020-04-05 DIAGNOSIS — Z3481 Encounter for supervision of other normal pregnancy, first trimester: Secondary | ICD-10-CM

## 2020-04-05 DIAGNOSIS — Z3A11 11 weeks gestation of pregnancy: Secondary | ICD-10-CM

## 2020-04-05 DIAGNOSIS — Z419 Encounter for procedure for purposes other than remedying health state, unspecified: Secondary | ICD-10-CM | POA: Diagnosis not present

## 2020-04-05 DIAGNOSIS — Z113 Encounter for screening for infections with a predominantly sexual mode of transmission: Secondary | ICD-10-CM

## 2020-04-05 DIAGNOSIS — Z1379 Encounter for other screening for genetic and chromosomal anomalies: Secondary | ICD-10-CM

## 2020-04-05 NOTE — Patient Instructions (Signed)
For nausea (these may be purchased over-the-counter): -Vitamin B6 (pyridoxine):  25 mg three times each day (may buy 100 mg tablet and take twice per day or try to cut into 4 equal pieces and take 1 piece three times each day).  - doxylamine (found in [employer] and other sleep agents that can be bought in the store): take 25 - 50 mg at bedtime.  May take up to 25 mg three time each day.  However, keep in mind that this might make you sleepy.  

## 2020-04-05 NOTE — Progress Notes (Signed)
  Routine Prenatal Care Visit  Subjective  Tammy Frey is a 34 y.o. [phone] at [redacted]w[redacted]d being seen today for ongoing prenatal care.  She is currently monitored for the following issues for this low-risk pregnancy and has Supervision of normal pregnancy on their problem list.  ----------------------------------------------------------------------------------- Patient reports no complaints.    . Vag. Bleeding: None.   . Leaking Fluid denies.  ----------------------------------------------------------------------------------- The following portions of the patient's history were reviewed and updated as appropriate: allergies, current medications, past family history, past medical history, past social history, past surgical history and problem list. Problem list updated.  Objective  Blood pressure 130/72, weight 144 lb (65.3 kg). Pregravid weight 148 lb (67.1 kg) Total Weight Gain -4 lb (-1.814 kg) Urinalysis: Urine Protein    Urine Glucose    Fetal Status: Fetal Heart Rate (bpm): 160         General:  Alert, oriented and cooperative. Patient is in no acute distress.  Skin: Skin is warm and dry. No rash noted.   Cardiovascular: Normal heart rate noted  Respiratory: Normal respiratory effort, no problems with respiration noted  Abdomen: Soft, gravid, appropriate for gestational age.       Pelvic:  Cervical exam deferred        Extremities: Normal range of motion.     Mental Status: Normal mood and affect. Normal behavior. Normal judgment and thought content.   Assessment   34 y.o. [id number] at [redacted]w[redacted]d by  10/20/2020, by Ultrasound presenting for routine prenatal visit  Plan   Pregnancy#3 Problems (from 01/14/20 to present)    No problems associated with this episode.       Preterm labor symptoms and general obstetric precautions including but not limited to vaginal bleeding, contractions, leaking of fluid and fetal movement were reviewed in detail with the patient. Please refer to  After Visit Summary for other counseling recommendations.   - labs today with NIPT and UDS.   Return in about 4 weeks (around 05/03/2020) for Routine Prenatal Appointment.  Thomasene Mohair, MD, [facility], [facility] 04/05/2020 3:28 PM

## 2020-04-10 LAB — RPR+RH+ABO+RUB AB+AB SCR+CB...
Antibody Screen: NEGATIVE
HIV Screen 4th Generation wRfx: NONREACTIVE
Hematocrit: 28 % — ABNORMAL LOW (ref 34.0–46.6)
Hemoglobin: 9.7 g/dL — ABNORMAL LOW (ref 11.1–15.9)
Hepatitis B Surface Ag: NEGATIVE
MCH: 30.9 pg (ref 26.6–33.0)
MCHC: 34.6 g/dL (ref 31.5–35.7)
MCV: 89 fL (ref 79–97)
Platelets: 206 10*3/uL (ref 150–450)
RBC: 3.14 x10E6/uL — ABNORMAL LOW (ref 3.77–5.28)
RDW: 13.2 % (ref 11.7–15.4)
RPR Ser Ql: NONREACTIVE
Rh Factor: POSITIVE
Rubella Antibodies, IGG: 2.18 index (ref >0.99)
Varicella zoster IgG: 374 index (ref >165)
WBC: 6.1 10*3/uL (ref 3.4–10.8)

## 2020-04-10 LAB — HGB FRACTIONATION CASCADE
Hgb A2: 2.6 % (ref 1.8–3.2)
Hgb A: 94.7 % — ABNORMAL LOW (ref 96.4–98.8)
Hgb F: 2.7 % — ABNORMAL HIGH (ref 0.0–2.0)
Hgb S: 0 %

## 2020-04-12 LAB — URINE DRUG PANEL 7
Amphetamines, Urine: NEGATIVE ng/mL
Barbiturate Quant, Ur: NEGATIVE ng/mL
Benzodiazepine Quant, Ur: NEGATIVE ng/mL
Cannabinoid Quant, Ur: POSITIVE — AB
Cocaine (Metab.): NEGATIVE ng/mL
Opiate Quant, Ur: NEGATIVE ng/mL
PCP Quant, Ur: NEGATIVE ng/mL

## 2020-04-12 LAB — MATERNIT 21 PLUS CORE, BLOOD
Fetal Fraction: 15
Result (T21): NEGATIVE
Trisomy 13 (Patau syndrome): NEGATIVE
Trisomy 18 (Edwards syndrome): NEGATIVE
Trisomy 21 (Down syndrome): NEGATIVE

## 2020-05-02 ENCOUNTER — Other Ambulatory Visit: Payer: Self-pay

## 2020-05-02 ENCOUNTER — Ambulatory Visit (INDEPENDENT_AMBULATORY_CARE_PROVIDER_SITE_OTHER): Payer: Medicaid Other | Admitting: Advanced Practice Midwife

## 2020-05-02 ENCOUNTER — Encounter: Payer: Self-pay | Admitting: Advanced Practice Midwife

## 2020-05-02 VITALS — BP 136/74 | Wt 150.0 lb

## 2020-05-02 DIAGNOSIS — Z3482 Encounter for supervision of other normal pregnancy, second trimester: Secondary | ICD-10-CM

## 2020-05-02 DIAGNOSIS — Z3A15 15 weeks gestation of pregnancy: Secondary | ICD-10-CM

## 2020-05-02 NOTE — Progress Notes (Signed)
ROB Acid reflux  

## 2020-05-02 NOTE — Progress Notes (Signed)
  Routine Prenatal Care Visit  Subjective  Tammy Frey is a 34 y.o. [phone] at 104w4d being seen today for ongoing prenatal care.  She is currently monitored for the following issues for this low-risk pregnancy and has Supervision of normal pregnancy on their problem list.  ----------------------------------------------------------------------------------- Patient reports mild heartburn. Today is her birthday and she celebrated over the weekend with a gender reveal party.    . Vag. Bleeding: None.  Movement: Present. Leaking Fluid denies.  ----------------------------------------------------------------------------------- The following portions of the patient's history were reviewed and updated as appropriate: allergies, current medications, past family history, past medical history, past social history, past surgical history and problem list. Problem list updated.  Objective  Blood pressure (!) 136/74, weight 150 lb (68 kg). Pregravid weight 148 lb (67.1 kg) Total Weight Gain 2 lb (0.907 kg) Urinalysis: Urine Protein    Urine Glucose    Fetal Status: Fetal Heart Rate (bpm): 154   Movement: Present     General:  Alert, oriented and cooperative. Patient is in no acute distress.  Skin: Skin is warm and dry. No rash noted.   Cardiovascular: Normal heart rate noted  Respiratory: Normal respiratory effort, no problems with respiration noted  Abdomen: Soft, gravid, appropriate for gestational age.       Pelvic:  Cervical exam deferred        Extremities: Normal range of motion.  Edema: None  Mental Status: Normal mood and affect. Normal behavior. Normal judgment and thought content.   Assessment   34 y.o. [id number] at [redacted]w[redacted]d by  10/20/2020, by Ultrasound presenting for routine prenatal visit  Plan   Pregnancy#3 Problems (from 01/14/20 to present)    No problems associated with this episode.       Preterm labor symptoms and general obstetric precautions including but not limited to  vaginal bleeding, contractions, leaking of fluid and fetal movement were reviewed in detail with the patient.   Return in about 4 weeks (around 05/30/2020) for anatomy scan and rob.  Tresea Mall, CNM 05/02/2020 2:12 PM

## 2020-05-06 DIAGNOSIS — Z419 Encounter for procedure for purposes other than remedying health state, unspecified: Secondary | ICD-10-CM | POA: Diagnosis not present

## 2020-05-28 ENCOUNTER — Other Ambulatory Visit: Payer: Self-pay | Admitting: Advanced Practice Midwife

## 2020-05-28 ENCOUNTER — Ambulatory Visit (INDEPENDENT_AMBULATORY_CARE_PROVIDER_SITE_OTHER): Payer: Medicaid Other

## 2020-05-28 ENCOUNTER — Other Ambulatory Visit: Payer: Self-pay

## 2020-05-28 ENCOUNTER — Encounter: Payer: Self-pay | Admitting: Advanced Practice Midwife

## 2020-05-28 ENCOUNTER — Ambulatory Visit (INDEPENDENT_AMBULATORY_CARE_PROVIDER_SITE_OTHER): Payer: Medicaid Other | Admitting: Advanced Practice Midwife

## 2020-05-28 VITALS — BP 122/69 | Wt 150.0 lb

## 2020-05-28 DIAGNOSIS — O4422 Partial placenta previa NOS or without hemorrhage, second trimester: Secondary | ICD-10-CM

## 2020-05-28 DIAGNOSIS — Z3482 Encounter for supervision of other normal pregnancy, second trimester: Secondary | ICD-10-CM

## 2020-05-28 DIAGNOSIS — Z3689 Encounter for other specified antenatal screening: Secondary | ICD-10-CM

## 2020-05-28 DIAGNOSIS — Z3A19 19 weeks gestation of pregnancy: Secondary | ICD-10-CM | POA: Diagnosis not present

## 2020-05-28 LAB — POCT URINALYSIS DIPSTICK OB
Glucose, UA: NEGATIVE
POC,PROTEIN,UA: NEGATIVE

## 2020-05-28 NOTE — Patient Instructions (Signed)
Placenta Previa Placenta previa is a condition in which the placenta implants in the lower part of the uterus in pregnant women. The placenta either partially or completely covers the opening to the cervix. This is a problem because the baby must pass through the cervix during delivery. There are three types of placenta previa:  Marginal placenta previa. The placenta reaches within an inch (2.5 cm) of the cervical opening but does not cover it.  Partial placenta previa. The placenta covers part of the cervical opening.  Complete placenta previa. The placenta covers the entire cervical opening. If the previa is marginal or partial and it is diagnosed in the first half of pregnancy, the placenta may move into a normal position as the pregnancy progresses and may no longer cover the cervix. It is important to keep all prenatal visits with your health care provider so you can be more closely monitored. What are the causes? The cause of this condition is not known. What increases the risk? This condition is more likely to develop in women who:  Are carrying more than one baby (multiples).  Have an abnormally shaped uterus.  Have scars on the lining of the uterus.  Have had surgeries involving the uterus, such as a cesarean delivery.  Have delivered a baby before.  Have a history of placenta previa.  Have smoked or used cocaine during pregnancy.  Are age 35 or older during pregnancy. What are the signs or symptoms? The main symptom of this condition is sudden, painless vaginal bleeding during the second half of pregnancy. The amount of bleeding can be very light at first, and it usually stops on its own. Heavier bleeding episodes may also happen. Some women with placenta previa may have no bleeding at all. How is this diagnosed?  This condition is diagnosed: ? From an ultrasound. This test uses sound waves to find where the placenta is located before you have any bleeding  episodes. ? During a checkup after vaginal bleeding is noticed.  If you are diagnosed with a partial or complete previa, digital exams with fingers will generally be avoided. Your health care provider will still perform a speculum exam.  If you did not have an ultrasound during your pregnancy, placenta previa may not be diagnosed until bleeding occurs during labor. How is this treated? Treatment for this condition may include:  Decreased activity.  Bed rest at home or in the hospital.  Pelvic rest. Nothing is placed inside the vagina during pelvic rest. This means not having sex and not using tampons or douches.  A blood transfusion to replace blood that you have lost (maternal blood loss).  A cesarean delivery. This may be performed if: ? The bleeding is heavy and cannot be controlled. ? The placenta completely covers the cervix.  Medicines to stop premature labor or to help the baby's lungs to mature. This treatment may be used if you need delivery before your pregnancy is full-term. Your treatment will be decided based on:  How much you are bleeding, or whether the bleeding has stopped.  How far along you are in your pregnancy.  The condition of your baby.  The type of placenta previa that you have.  Follow these instructions at home:  Get plenty of rest and lessen activity as told by your health care provider.  Stay on bed rest for as long as told by your health care provider.  Do not have sex, use tampons, use a douche, or place anything inside of   your vagina if your health care provider recommended pelvic rest.  Take over-the-counter and prescription medicines as told by your health care provider.  Keep all follow-up visits as told by your health care provider. This is important. Get help right away if:  You have vaginal bleeding, even if in small amounts and even if you have no pain.  You have cramping or regular contractions.  You have pain in your abdomen or  your lower back.  You have a feeling of increased pressure in your pelvis.  You have increased watery or bloody mucus from the vagina. This information is not intended to replace advice given to you by your health care provider. Make sure you discuss any questions you have with your health care provider. Document Revised: 09/04/2017 Document Reviewed: 04/05/2016 Elsevier Patient Education  2020 Elsevier Inc.  

## 2020-05-28 NOTE — Progress Notes (Signed)
  Routine Prenatal Care Visit  Subjective  Tammy Frey is a 34 y.o. [phone] at [redacted]w[redacted]d being seen today for ongoing prenatal care.  She is currently monitored for the following issues for this low-risk pregnancy and has Supervision of normal pregnancy on their problem list.  ----------------------------------------------------------------------------------- Patient reports no complaints.  We discussed results of anatomy scan- posterior placenta previa. Information given.  . Vag. Bleeding: None.  Movement: Present. Leaking Fluid denies.  ----------------------------------------------------------------------------------- The following portions of the patient's history were reviewed and updated as appropriate: allergies, current medications, past family history, past medical history, past social history, past surgical history and problem list. Problem list updated.  Objective  Blood pressure 122/69, weight 150 lb (68 kg). Pregravid weight 148 lb (67.1 kg) Total Weight Gain 2 lb (0.907 kg) Urinalysis: Urine Protein    Urine Glucose    Fetal Status: Fetal Heart Rate (bpm): 156 Fundal Height: 19 cm Movement: Present      Anatomy scan: complete, normal, female, breech, placenta is posterior previa  General:  Alert, oriented and cooperative. Patient is in no acute distress.  Skin: Skin is warm and dry. No rash noted.   Cardiovascular: Normal heart rate noted  Respiratory: Normal respiratory effort, no problems with respiration noted  Abdomen: Soft, gravid, appropriate for gestational age.       Pelvic:  Cervical exam deferred        Extremities: Normal range of motion.  Edema: None  Mental Status: Normal mood and affect. Normal behavior. Normal judgment and thought content.   Assessment   34 y.o. [id number] at [redacted]w[redacted]d by  10/20/2020, by Ultrasound presenting for routine prenatal visit  Plan   Pregnancy#3 Problems (from 01/14/20 to present)    Problem Noted Resolved   Supervision of normal  pregnancy 03/09/2020 by Tresea Mall, CNM No   Overview Signed 03/09/2020  8:58 AM by Tresea Mall, CNM    Clinic [facility] Prenatal Labs  Dating EDD by 7w u/s Blood type: --/--/O POS Performed at [facility], [address] [place], [place] [address]  [id number] [phone])   Genetic Screen 1 Screen:    AFP:     Quad:     NIPS: Antibody:   Anatomic [place]  Rubella:   Varicella: @VZVIGG @  GTT Early: NA              Third trimester:  RPR:     Rhogam  HBsAg: non reactive surface antigen; reactive surface antibody (11/17 0000)   Vaccines TDAP:                       Flu Shot: HIV:     Baby Food Formula                               GBS:   GC/CT:  Contraception  Pap: 03/08/2020:  CBB     CS/VBAC NA   Support Person Partner John           Posterior placenta previa: recheck at 28 weeks Pelvic rest   Preterm labor symptoms and general obstetric precautions including but not limited to vaginal bleeding, contractions, leaking of fluid and fetal movement were reviewed in detail with the patient. Please refer to After Visit Summary for other counseling recommendations.   Return in about 4 weeks (around 06/25/2020) for rob.  06/27/2020, CNM 05/28/2020 2:39 PM

## 2020-05-28 NOTE — Progress Notes (Signed)
No concerns.rj 

## 2020-06-04 ENCOUNTER — Other Ambulatory Visit: Payer: Self-pay

## 2020-06-04 ENCOUNTER — Emergency Department
Admission: EM | Admit: 2020-06-04 | Discharge: 2020-06-04 | Disposition: A | Discharge status: Home / Self Care | Payer: Medicaid Other | Attending: Emergency Medicine | Admitting: Emergency Medicine

## 2020-06-04 ENCOUNTER — Encounter: Payer: Self-pay | Admitting: Emergency Medicine

## 2020-06-04 DIAGNOSIS — R040 Epistaxis: Secondary | ICD-10-CM | POA: Insufficient documentation

## 2020-06-04 DIAGNOSIS — Z3A00 Weeks of gestation of pregnancy not specified: Secondary | ICD-10-CM | POA: Diagnosis not present

## 2020-06-04 DIAGNOSIS — Z5321 Procedure and treatment not carried out due to patient leaving prior to being seen by health care provider: Secondary | ICD-10-CM | POA: Insufficient documentation

## 2020-06-04 DIAGNOSIS — R42 Dizziness and giddiness: Secondary | ICD-10-CM | POA: Diagnosis not present

## 2020-06-04 DIAGNOSIS — O99891 Other specified diseases and conditions complicating pregnancy: Secondary | ICD-10-CM | POA: Diagnosis not present

## 2020-06-04 DIAGNOSIS — R519 Headache, unspecified: Secondary | ICD-10-CM | POA: Insufficient documentation

## 2020-06-04 LAB — URINALYSIS, COMPLETE (UACMP) WITH MICROSCOPIC
Bacteria, UA: NONE SEEN
Bilirubin Urine: NEGATIVE
Glucose, UA: NEGATIVE mg/dL
Hgb urine dipstick: NEGATIVE
Ketones, ur: NEGATIVE mg/dL
Leukocytes,Ua: NEGATIVE
Nitrite: NEGATIVE
Protein, ur: NEGATIVE mg/dL
Specific Gravity, Urine: 1.016 (ref 1.005–1.030)
pH: 6 (ref 5.0–8.0)

## 2020-06-04 LAB — COMPREHENSIVE METABOLIC PANEL
ALT: 57 U/L — ABNORMAL HIGH (ref 0–44)
AST: 39 U/L (ref 15–41)
Albumin: 3.6 g/dL (ref 3.5–5.0)
Alkaline Phosphatase: 36 U/L — ABNORMAL LOW (ref 38–126)
Anion gap: 7 (ref 5–15)
BUN: 8 mg/dL (ref 6–20)
CO2: 22 mmol/L (ref 22–32)
Calcium: 8.3 mg/dL — ABNORMAL LOW (ref 8.9–10.3)
Chloride: 107 mmol/L (ref 98–111)
Creatinine, Ser: 0.46 mg/dL (ref 0.44–1.00)
GFR calc Af Amer: >60 mL/min (ref >60)
GFR calc non Af Amer: >60 mL/min (ref >60)
Glucose, Bld: 87 mg/dL (ref 70–99)
Potassium: 3.7 mmol/L (ref 3.5–5.1)
Sodium: 136 mmol/L (ref 135–145)
Total Bilirubin: 0.4 mg/dL (ref 0.3–1.2)
Total Protein: 6.8 g/dL (ref 6.5–8.1)

## 2020-06-04 LAB — CBC
HCT: 24.7 % — ABNORMAL LOW (ref 36.0–46.0)
Hemoglobin: 8.3 g/dL — ABNORMAL LOW (ref 12.0–15.0)
MCH: 31.1 pg (ref 26.0–34.0)
MCHC: 33.6 g/dL (ref 30.0–36.0)
MCV: 92.5 fL (ref 80.0–100.0)
Platelets: 190 10*3/uL (ref 150–400)
RBC: 2.67 MIL/uL — ABNORMAL LOW (ref 3.87–5.11)
RDW: 13.7 % (ref 11.5–15.5)
WBC: 8 10*3/uL (ref 4.0–10.5)
nRBC: 0 % (ref 0.0–0.2)

## 2020-06-04 LAB — LIPASE, BLOOD: Lipase: 22 U/L (ref 11–51)

## 2020-06-04 NOTE — ED Triage Notes (Signed)
Says nosebleed this am while working.  Says she felt dizzy at the time.  She is 5 month pregnant--[facility] .  Says seh has headache at [place].

## 2020-06-04 NOTE — ED Notes (Signed)
FHT 156

## 2020-06-05 ENCOUNTER — Telehealth: Payer: Self-pay | Admitting: *Deleted

## 2020-06-05 NOTE — Telephone Encounter (Signed)
Tammy Frey presented to the ED and left before being seen by the provider on 06/03/20. The patient has been enrolled in an automated general discharge outreach program and 2 attempts to contact the patient will be made to follow up on their ED visit and subsequent needs. The care management team is available to provide assistance to this patient at any time.   Burnard Bunting, RN, BSN, CCRN [facility] [phone]

## 2020-06-06 DIAGNOSIS — Z419 Encounter for procedure for purposes other than remedying health state, unspecified: Secondary | ICD-10-CM | POA: Diagnosis not present

## 2020-06-28 ENCOUNTER — Encounter: Payer: Self-pay | Admitting: Advanced Practice Midwife

## 2020-06-28 ENCOUNTER — Other Ambulatory Visit: Payer: Self-pay

## 2020-06-28 ENCOUNTER — Ambulatory Visit (INDEPENDENT_AMBULATORY_CARE_PROVIDER_SITE_OTHER): Payer: Medicaid Other | Admitting: Advanced Practice Midwife

## 2020-06-28 VITALS — BP 136/84 | Wt 163.0 lb

## 2020-06-28 DIAGNOSIS — Z131 Encounter for screening for diabetes mellitus: Secondary | ICD-10-CM

## 2020-06-28 DIAGNOSIS — Z113 Encounter for screening for infections with a predominantly sexual mode of transmission: Secondary | ICD-10-CM

## 2020-06-28 DIAGNOSIS — Z130 Encounter for screening for diseases of the blood and blood-forming organs and certain disorders involving the immune mechanism: Secondary | ICD-10-CM

## 2020-06-28 DIAGNOSIS — Z3A23 23 weeks gestation of pregnancy: Secondary | ICD-10-CM

## 2020-06-28 DIAGNOSIS — Z3482 Encounter for supervision of other normal pregnancy, second trimester: Secondary | ICD-10-CM

## 2020-06-28 NOTE — Progress Notes (Signed)
  Routine Prenatal Care Visit  Subjective  Tammy Frey is a 34 y.o. [phone] at [redacted]w[redacted]d being seen today for ongoing prenatal care.  She is currently monitored for the following issues for this low-risk pregnancy and has Supervision of normal pregnancy on their problem list.  ----------------------------------------------------------------------------------- Patient reports she received the sad news of her grandfather's passing today. She has been having some general discomforts .    . Vag. Bleeding: None.  Movement: Present. Leaking Fluid denies.  ----------------------------------------------------------------------------------- The following portions of the patient's history were reviewed and updated as appropriate: allergies, current medications, past family history, past medical history, past social history, past surgical history and problem list. Problem list updated.  Objective  Blood pressure 136/84, weight 163 lb (73.9 kg). Pregravid weight 148 lb (67.1 kg) Total Weight Gain 15 lb (6.804 kg) Urinalysis: Urine Protein    Urine Glucose    Fetal Status: Fetal Heart Rate (bpm): 159 Fundal Height: 24 cm Movement: Present     General:  Alert, oriented and cooperative. Patient is in no acute distress.  Skin: Skin is warm and dry. No rash noted.   Cardiovascular: Normal heart rate noted  Respiratory: Normal respiratory effort, no problems with respiration noted  Abdomen: Soft, gravid, appropriate for gestational age.       Pelvic:  Cervical exam deferred        Extremities: Normal range of motion.  Edema: None  Mental Status: Normal mood and affect. Normal behavior. Normal judgment and thought content.   Assessment   34 y.o. [id number] at [redacted]w[redacted]d by  10/20/2020, by Ultrasound presenting for routine prenatal visit  Plan   Pregnancy#3 Problems (from 01/14/20 to present)    Problem Noted Resolved   Supervision of normal pregnancy 03/09/2020 by Tresea Mall, CNM No   Overview Addendum  06/04/2020  1:23 PM by Farrel Conners, CNM    Clinic [facility] Prenatal Labs  Dating EDD by 7w u/s Blood type:     Genetic Screen 1 Screen:    AFP:     Quad:     NIPS: Antibody:   Anatomic [place]  Rubella:   Varicella: @VZVIGG @  GTT Early: NA              Third trimester:  RPR:     Rhogam  HBsAg:     Vaccines TDAP:                       Flu Shot: HIV:     Baby Food Formula                               GBS:   GC/CT:  Contraception  Pap: 03/08/2020:  CBB     CS/VBAC NA   Support Person Partner John          Previous Version       Preterm labor symptoms and general obstetric precautions including but not limited to vaginal bleeding, contractions, leaking of fluid and fetal movement were reviewed in detail with the patient.   Return in about 4 weeks (around 07/26/2020) for u/s placenta/28 wk labs/rob.  07/28/2020, CNM 06/28/2020 2:50 PM

## 2020-06-28 NOTE — Progress Notes (Signed)
No vb. No lof.  

## 2020-07-06 DIAGNOSIS — Z419 Encounter for procedure for purposes other than remedying health state, unspecified: Secondary | ICD-10-CM | POA: Diagnosis not present

## 2020-07-26 ENCOUNTER — Ambulatory Visit (INDEPENDENT_AMBULATORY_CARE_PROVIDER_SITE_OTHER): Payer: Medicaid Other

## 2020-07-26 ENCOUNTER — Other Ambulatory Visit: Payer: Medicaid Other

## 2020-07-26 ENCOUNTER — Ambulatory Visit (INDEPENDENT_AMBULATORY_CARE_PROVIDER_SITE_OTHER): Payer: Medicaid Other | Admitting: Obstetrics & Gynecology

## 2020-07-26 ENCOUNTER — Encounter: Payer: Self-pay | Admitting: Obstetrics & Gynecology

## 2020-07-26 ENCOUNTER — Other Ambulatory Visit: Payer: Self-pay

## 2020-07-26 ENCOUNTER — Other Ambulatory Visit: Payer: Self-pay | Admitting: Advanced Practice Midwife

## 2020-07-26 VITALS — BP 130/80 | Wt 162.0 lb

## 2020-07-26 DIAGNOSIS — Z3482 Encounter for supervision of other normal pregnancy, second trimester: Secondary | ICD-10-CM

## 2020-07-26 DIAGNOSIS — Z3A27 27 weeks gestation of pregnancy: Secondary | ICD-10-CM

## 2020-07-26 DIAGNOSIS — Z113 Encounter for screening for infections with a predominantly sexual mode of transmission: Secondary | ICD-10-CM

## 2020-07-26 DIAGNOSIS — Z130 Encounter for screening for diseases of the blood and blood-forming organs and certain disorders involving the immune mechanism: Secondary | ICD-10-CM

## 2020-07-26 DIAGNOSIS — Z131 Encounter for screening for diabetes mellitus: Secondary | ICD-10-CM

## 2020-07-26 DIAGNOSIS — Z23 Encounter for immunization: Secondary | ICD-10-CM | POA: Diagnosis not present

## 2020-07-26 LAB — POCT URINALYSIS DIPSTICK OB
Glucose, UA: NEGATIVE
POC,PROTEIN,UA: NEGATIVE

## 2020-07-26 NOTE — Progress Notes (Signed)
  Subjective  Fetal Movement? yes Contractions? no Leaking Fluid? no Vaginal Bleeding? no  Objective  BP 130/80   Wt 162 lb (73.5 kg)   LMP  (LMP Unknown)   BMI 25.37 kg/m  General: NAD Pumonary: no increased work of breathing Abdomen: gravid, non-tender Extremities: no edema Psychiatric: mood appropriate, affect full  Review of ULTRASOUND.    I have personally reviewed images and report of recent ultrasound done at [facility].    Plan of management to be discussed with patient. See [place] note about placenta, blood vessels, and cervix   Assessment  34 y.o. [id number] at [redacted]w[redacted]d by  10/20/2020, by Ultrasound presenting for routine prenatal visit  Plan   Problem List Items Addressed This Visit    Placenta is low w either accessory lobe or vasa previa, concern for risk for delivery as it presents itself currently, recommend CS at 36-37 weeks. Plan receck [place] 4 weeks Pelvic rest for patient Sx's to watch for    Visit Diagnoses    [redacted] weeks gestation of pregnancy     - PNV, [facility] - Glucola today    Relevant Orders   POC Urinalysis Dipstick OB (Completed)   Need for immunization against influenza       Relevant Orders   Flu Vaccine QUAD 36+ mos IM (Completed)       Annamarie Major, MD, [facility], [facility] 07/26/2020  4:40 PM

## 2020-07-27 LAB — 28 WEEK RH+PANEL
Basophils Absolute: 0 10*3/uL (ref 0.0–0.2)
Basos: 0 %
EOS (ABSOLUTE): 0.1 10*3/uL (ref 0.0–0.4)
Eos: 2 %
Gestational Diabetes Screen: 81 mg/dL (ref 65–139)
HIV Screen 4th Generation wRfx: NONREACTIVE
Hematocrit: 25.7 % — ABNORMAL LOW (ref 34.0–46.6)
Hemoglobin: 8.9 g/dL — ABNORMAL LOW (ref 11.1–15.9)
Immature Grans (Abs): 0.1 10*3/uL (ref 0.0–0.1)
Immature Granulocytes: 1 %
Lymphocytes Absolute: 2 10*3/uL (ref 0.7–3.1)
Lymphs: 28 %
MCH: 31.6 pg (ref 26.6–33.0)
MCHC: 34.6 g/dL (ref 31.5–35.7)
MCV: 91 fL (ref 79–97)
Monocytes Absolute: 0.6 10*3/uL (ref 0.1–0.9)
Monocytes: 8 %
Neutrophils Absolute: 4.3 10*3/uL (ref 1.4–7.0)
Neutrophils: 61 %
Platelets: 220 10*3/uL (ref 150–450)
RBC: 2.82 x10E6/uL — ABNORMAL LOW (ref 3.77–5.28)
RDW: 12.7 % (ref 11.7–15.4)
RPR Ser Ql: NONREACTIVE
WBC: 7.1 10*3/uL (ref 3.4–10.8)

## 2020-08-06 DIAGNOSIS — Z419 Encounter for procedure for purposes other than remedying health state, unspecified: Secondary | ICD-10-CM | POA: Diagnosis not present

## 2020-08-10 ENCOUNTER — Other Ambulatory Visit: Payer: Self-pay

## 2020-08-10 ENCOUNTER — Ambulatory Visit (INDEPENDENT_AMBULATORY_CARE_PROVIDER_SITE_OTHER): Payer: Medicaid Other | Admitting: Obstetrics and Gynecology

## 2020-08-10 VITALS — BP 115/70 | Wt 162.6 lb

## 2020-08-10 DIAGNOSIS — O99013 Anemia complicating pregnancy, third trimester: Secondary | ICD-10-CM | POA: Diagnosis not present

## 2020-08-10 DIAGNOSIS — Z3A29 29 weeks gestation of pregnancy: Secondary | ICD-10-CM

## 2020-08-10 DIAGNOSIS — Z3403 Encounter for supervision of normal first pregnancy, third trimester: Secondary | ICD-10-CM

## 2020-08-10 LAB — POCT URINALYSIS DIPSTICK OB
Glucose, UA: NEGATIVE
POC,PROTEIN,UA: NEGATIVE

## 2020-08-10 NOTE — Progress Notes (Signed)
Routine Prenatal Care Visit  Subjective  Tammy Frey is a 34 y.o. [phone] at [redacted]w[redacted]d being seen today for ongoing prenatal care.  She is currently monitored for the following issues for this high-risk pregnancy and has Supervision of normal pregnancy on their problem list.  ----------------------------------------------------------------------------------- Patient reports no complaints.   Contractions: Irregular. Vag. Bleeding: None.  Movement: Present. Denies leaking of fluid.  ----------------------------------------------------------------------------------- The following portions of the patient's history were reviewed and updated as appropriate: allergies, current medications, past family history, past medical history, past social history, past surgical history and problem list. Problem list updated.   Objective  Blood pressure 115/70, weight 162 lb 9.6 oz (73.8 kg). Pregravid weight 148 lb (67.1 kg) Total Weight Gain 14 lb 9.6 oz (6.623 kg) Urinalysis:      Fetal Status: Fetal Heart Rate (bpm): 140 Fundal Height: 29 cm Movement: Present     General:  Alert, oriented and cooperative. Patient is in no acute distress.  Skin: Skin is warm and dry. No rash noted.   Cardiovascular: Normal heart rate noted  Respiratory: Normal respiratory effort, no problems with respiration noted  Abdomen: Soft, gravid, appropriate for gestational age. Pain/Pressure: Present     Pelvic:  Cervical exam deferred        Extremities: Normal range of motion.  Edema: None  Mental Status: Normal mood and affect. Normal behavior. Normal judgment and thought content.     Assessment   34 y.o. [id number] at [redacted]w[redacted]d by  10/20/2020, by Ultrasound presenting for routine prenatal visit  Plan   Pregnancy#3 Problems (from 01/14/20 to present)    Problem Noted Resolved   Supervision of normal pregnancy 03/09/2020 by Tresea Mall, CNM No   Overview Addendum 08/10/2020 12:08 PM by Natale Milch, MD       Nursing Staff Provider  Office Location  [facility] Dating    Language  English Anatomy US    Flu Vaccine  07/26/2020 Genetic Screen  NIPS:  Normal, gender surprise   TDaP vaccine    Hgb A1C or  GTT Early : Third trimester : normal  Rhogam   not needed   LAB RESULTS   Feeding Plan formula Blood Type O/Positive/-- (07/01 1447)   Contraception  Antibody Negative (07/01 1447)  Circumcision  Rubella 2.18 (07/01 1447)  Pediatrician   RPR Non Reactive (10/21 1537)   Support Person  partner John HBsAg Negative (07/01 1447)   Prenatal Classes  HIV Non Reactive (10/21 1537)    Varicella immune  BTL Consent  GBS  (For PCN allergy, check sensitivities)        VBAC Consent  Pap      Hgb Electro      CF      SMA          High Risk pregnancy Diagnoses Vasa previa vs. Placenta previa- MFM referral Anemia in pregnancy, persistent hgb F- hematology referral placed        Previous Version      MFM referral- possible vasa previa- delivery per ACOG 34-37 weeks. Discussed betamethasone before planned delivery. Discussed pelvic rest and to come to the hospital for persistent contractions or emergently with any bleeding.  Hematology referral- anemia and persistent fetal hgb. Patient does not tolerate oral iron  Gestational age appropriate obstetric precautions including but not limited to vaginal bleeding, contractions, leaking of fluid and fetal movement were reviewed in detail with the patient.    Return in about 2 weeks (around 08/24/2020) for  ROB in person.  Natale Milch MD [facility], [facility] 08/10/2020, 12:14 PM

## 2020-08-10 NOTE — Patient Instructions (Addendum)
Third Trimester of Pregnancy The third trimester is from week 28 through week 40 (months 7 through 9). The third trimester is a time when the unborn baby (fetus) is growing rapidly. At the end of the ninth month, the fetus is about 20 inches in length and weighs 6-10 pounds. Body changes during your third trimester Your body will continue to go through many changes during pregnancy. The changes vary from woman to woman. During the third trimester:  Your weight will continue to increase. You can expect to gain 25-35 pounds (11-16 kg) by the end of the pregnancy.  You may begin to get stretch marks on your hips, abdomen, and breasts.  You may urinate more often because the fetus is moving lower into your pelvis and pressing on your bladder.  You may develop or continue to have heartburn. This is caused by increased hormones that slow down muscles in the digestive tract.  You may develop or continue to have constipation because increased hormones slow digestion and cause the muscles that push waste through your intestines to relax.  You may develop hemorrhoids. These are swollen veins (varicose veins) in the rectum that can itch or be painful.  You may develop swollen, bulging veins (varicose veins) in your legs.  You may have increased body aches in the pelvis, back, or thighs. This is due to weight gain and increased hormones that are relaxing your joints.  You may have changes in your hair. These can include thickening of your hair, rapid growth, and changes in texture. Some women also have hair loss during or after pregnancy, or hair that feels dry or thin. Your hair will most likely return to normal after your baby is born.  Your breasts will continue to grow and they will continue to become tender. A yellow fluid (colostrum) may leak from your breasts. This is the first milk you are producing for your baby.  Your belly button may stick out.  You may notice more swelling in your hands,  face, or ankles.  You may have increased tingling or numbness in your hands, arms, and legs. The skin on your belly may also feel numb.  You may feel short of breath because of your expanding uterus.  You may have more problems sleeping. This can be caused by the size of your belly, increased need to urinate, and an increase in your body's metabolism.  You may notice the fetus "dropping," or moving lower in your abdomen (lightening).  You may have increased vaginal discharge.  You may notice your joints feel loose and you may have pain around your pelvic bone. What to expect at prenatal visits You will have prenatal exams every 2 weeks until week 36. Then you will have weekly prenatal exams. During a routine prenatal visit:  You will be weighed to make sure you and the baby are growing normally.  Your blood pressure will be taken.  Your abdomen will be measured to track your baby's growth.  The fetal heartbeat will be listened to.  Any test results from the previous visit will be discussed.  You may have a cervical check near your due date to see if your cervix has softened or thinned (effaced).  You will be tested for Group B streptococcus. This happens between 35 and 37 weeks. Your health care provider may ask you:  What your birth plan is.  How you are feeling.  If you are feeling the baby move.  If you have had any abnormal   symptoms, such as leaking fluid, bleeding, severe headaches, or abdominal cramping.  If you are using any tobacco products, including cigarettes, chewing tobacco, and electronic cigarettes.  If you have any questions. Other tests or screenings that may be performed during your third trimester include:  Blood tests that check for low iron levels (anemia).  Fetal testing to check the health, activity level, and growth of the fetus. Testing is done if you have certain medical conditions or if there are problems during the pregnancy.  Nonstress test  (NST). This test checks the health of your baby to make sure there are no signs of problems, such as the baby not getting enough oxygen. During this test, a belt is placed around your belly. The baby is made to move, and its heart rate is monitored during movement. What is false labor? False labor is a condition in which you feel small, irregular tightenings of the muscles in the womb (contractions) that usually go away with rest, changing position, or drinking water. These are called Braxton Hicks contractions. Contractions may last for hours, days, or even weeks before true labor sets in. If contractions come at regular intervals, become more frequent, increase in intensity, or become painful, you should see your health care provider. What are the signs of labor?  Abdominal cramps.  Regular contractions that start at 10 minutes apart and become stronger and more frequent with time.  Contractions that start on the top of the uterus and spread down to the lower abdomen and back.  Increased pelvic pressure and dull back pain.  A watery or bloody mucus discharge that comes from the vagina.  Leaking of amniotic fluid. This is also known as your "water breaking." It could be a slow trickle or a gush. Let your health care provider know if it has a color or strange odor. If you have any of these signs, call your health care provider right away, even if it is before your due date. Follow these instructions at home: Medicines  Follow your health care provider's instructions regarding medicine use. Specific medicines may be either safe or unsafe to take during pregnancy.  Take a prenatal vitamin that contains at least 600 micrograms (mcg) of folic acid.  If you develop constipation, try taking a stool softener if your health care provider approves. Eating and drinking   Eat a balanced diet that includes fresh fruits and vegetables, whole grains, good sources of protein such as meat, eggs, or tofu,  and low-fat dairy. Your health care provider will help you determine the amount of weight gain that is right for you.  Avoid raw meat and uncooked cheese. These carry germs that can cause birth defects in the baby.  If you have low calcium intake from food, talk to your health care provider about whether you should take a daily calcium supplement.  Eat four or five small meals rather than three large meals a day.  Limit foods that are high in fat and processed sugars, such as fried and sweet foods.  To prevent constipation: ? Drink enough fluid to keep your urine clear or pale yellow. ? Eat foods that are high in fiber, such as fresh fruits and vegetables, whole grains, and beans. Activity  Exercise only as directed by your health care provider. Most women can continue their usual exercise routine during pregnancy. Try to exercise for 30 minutes at least 5 days a week. Stop exercising if you experience uterine contractions.  Avoid heavy lifting.  Do   not exercise in extreme heat or humidity, or at high altitudes.  Wear low-heel, comfortable shoes.  Practice good posture.  You may continue to have sex unless your health care provider tells you otherwise. Relieving pain and discomfort  Take frequent breaks and rest with your legs elevated if you have leg cramps or low back pain.  Take warm sitz baths to soothe any pain or discomfort caused by hemorrhoids. Use hemorrhoid cream if your health care provider approves.  Wear a good support bra to prevent discomfort from breast tenderness.  If you develop varicose veins: ? Wear support pantyhose or compression stockings as told by your healthcare provider. ? Elevate your feet for 15 minutes, 3-4 times a day. Prenatal care  Write down your questions. Take them to your prenatal visits.  Keep all your prenatal visits as told by your health care provider. This is important. Safety  Wear your seat belt at all times when driving.  Make  a list of emergency phone numbers, including numbers for family, friends, the hospital, and police and fire departments. General instructions  Avoid cat litter boxes and soil used by cats. These carry germs that can cause birth defects in the baby. If you have a cat, ask someone to clean the litter box for you.  Do not travel far distances unless it is absolutely necessary and only with the approval of your health care provider.  Do not use hot tubs, steam rooms, or saunas.  Do not drink alcohol.  Do not use any products that contain nicotine or tobacco, such as cigarettes and e-cigarettes. If you need help quitting, ask your health care provider.  Do not use any medicinal herbs or unprescribed drugs. These chemicals affect the formation and growth of the baby.  Do not douche or use tampons or scented sanitary pads.  Do not cross your legs for long periods of time.  To prepare for the arrival of your baby: ? Take prenatal classes to understand, practice, and ask questions about labor and delivery. ? Make a trial run to the hospital. ? Visit the hospital and tour the maternity area. ? Arrange for maternity or paternity leave through employers. ? Arrange for family and friends to take care of pets while you are in the hospital. ? Purchase a rear-facing car seat and make sure you know how to install it in your car. ? Pack your hospital bag. ? Prepare the baby's nursery. Make sure to remove all pillows and stuffed animals from the baby's crib to prevent suffocation.  Visit your dentist if you have not gone during your pregnancy. Use a soft toothbrush to brush your teeth and be gentle when you floss. Contact a health care provider if:  You are unsure if you are in labor or if your water has broken.  You become dizzy.  You have mild pelvic cramps, pelvic pressure, or nagging pain in your abdominal area.  You have lower back pain.  You have persistent nausea, vomiting, or  diarrhea.  You have an unusual or bad smelling vaginal discharge.  You have pain when you urinate. Get help right away if:  Your water breaks before 37 weeks.  You have regular contractions less than 5 minutes apart before 37 weeks.  You have a fever.  You are leaking fluid from your vagina.  You have spotting or bleeding from your vagina.  You have severe abdominal pain or cramping.  You have rapid weight loss or weight gain.  You have   shortness of breath with chest pain.  You notice sudden or extreme swelling of your face, hands, ankles, feet, or legs.  Your baby makes fewer than 10 movements in 2 hours.  You have severe headaches that do not go away when you take medicine.  You have vision changes. Summary  The third trimester is from week 28 through week 40, months 7 through 9. The third trimester is a time when the unborn baby (fetus) is growing rapidly.  During the third trimester, your discomfort may increase as you and your baby continue to gain weight. You may have abdominal, leg, and back pain, sleeping problems, and an increased need to urinate.  During the third trimester your breasts will keep growing and they will continue to become tender. A yellow fluid (colostrum) may leak from your breasts. This is the first milk you are producing for your baby.  False labor is a condition in which you feel small, irregular tightenings of the muscles in the womb (contractions) that eventually go away. These are called Braxton Hicks contractions. Contractions may last for hours, days, or even weeks before true labor sets in.  Signs of labor can include: abdominal cramps; regular contractions that start at 10 minutes apart and become stronger and more frequent with time; watery or bloody mucus discharge that comes from the vagina; increased pelvic pressure and dull back pain; and leaking of amniotic fluid. This information is not intended to replace advice given to you by your  health care provider. Make sure you discuss any questions you have with your health care provider. Document Revised: 01/13/2019 Document Reviewed: 10/28/2016 [employer] Patient Education  2020 [employer]   Iron-Rich Diet  Iron is a mineral that helps your body to produce hemoglobin. Hemoglobin is a protein in red blood cells that carries oxygen to your body's tissues. Eating too little iron may cause you to feel weak and tired, and it can increase your risk of infection. Iron is naturally found in many foods, and many foods have iron added to them (iron-fortified foods). You may need to follow an iron-rich diet if you do not have enough iron in your body due to certain medical conditions. The amount of iron that you need each day depends on your age, your sex, and any medical conditions you have. Follow instructions from your health care provider or a diet and nutrition specialist (dietitian) about how much iron you should eat each day. What are tips for following this plan? Reading food labels  Check food labels to see how many milligrams (mg) of iron are in each serving. Cooking  Cook foods in pots and pans that are made from iron.  Take these steps to make it easier for your body to absorb iron from certain foods: ? Soak beans overnight before cooking. ? Soak whole grains overnight and drain them before using. ? Ferment flours before baking, such as by using yeast in bread dough. Meal planning  When you eat foods that contain iron, you should eat them with foods that are high in vitamin C. These include oranges, peppers, tomatoes, potatoes, and mango. Vitamin C helps your body to absorb iron. General information  Take iron supplements only as told by your health care provider. An overdose of iron can be life-threatening. If you were prescribed iron supplements, take them with orange juice or a vitamin C supplement.  When you eat iron-fortified foods or take an iron supplement, you  should also eat foods that naturally contain  iron supplement, you should also eat foods that naturally contain iron, such as meat, poultry, and fish. Eating naturally iron-rich foods helps your body to absorb the iron that is added to other foods or contained in a supplement.  Certain foods and drinks prevent your body from absorbing iron properly. Avoid eating these foods in the same meal as iron-rich foods or with iron supplements. These foods include: ? Coffee, black tea, and red wine. ? Milk, dairy products, and foods that are high in calcium. ? Beans and soybeans. ? Whole grains. What foods should I eat? Fruits Prunes. Raisins. Eat fruits high in vitamin C, such as oranges, grapefruits, and strawberries, alongside iron-rich foods. Vegetables Spinach (cooked). Green peas. Broccoli. Fermented vegetables. Eat vegetables high in vitamin C, such as leafy greens, potatoes, bell peppers, and tomatoes, alongside iron-rich foods. Grains Iron-fortified breakfast cereal. Iron-fortified whole-wheat bread. Enriched rice. Sprouted grains. Meats and other proteins Beef liver. Oysters. Beef. Shrimp. Turkey. Chicken. Tuna. Sardines. Chickpeas. Nuts. Tofu. Pumpkin seeds. Beverages Tomato juice. Fresh orange juice. Prune juice. Hibiscus tea. Fortified instant breakfast shakes. Sweets and desserts Blackstrap molasses. Seasonings and condiments Tahini. Fermented soy sauce. Other foods Wheat germ. The items listed above may not be a complete list of recommended foods and beverages. Contact a dietitian for more information. What foods should I avoid? Grains Whole grains. Bran cereal. Bran flour. Oats. Meats and other proteins Soybeans. Products made from soy protein. Black beans. Lentils. Mung beans. Split peas. Dairy Milk. Cream. Cheese. Yogurt. Cottage cheese. Beverages Coffee. Black tea. Red wine. Sweets and desserts Cocoa. Chocolate. Ice cream. Other foods Basil. Oregano. Large amounts of parsley. The items listed above may not be a  complete list of foods and beverages to avoid. Contact a dietitian for more information. Summary  Iron is a mineral that helps your body to produce hemoglobin. Hemoglobin is a protein in red blood cells that carries oxygen to your body's tissues.  Iron is naturally found in many foods, and many foods have iron added to them (iron-fortified foods).  When you eat foods that contain iron, you should eat them with foods that are high in vitamin C. Vitamin C helps your body to absorb iron.  Certain foods and drinks prevent your body from absorbing iron properly, such as whole grains and dairy products. You should avoid eating these foods in the same meal as iron-rich foods or with iron supplements. This information is not intended to replace advice given to you by your health care provider. Make sure you discuss any questions you have with your health care provider. Document Revised: 09/04/2017 Document Reviewed: 08/18/2017 [employer] Patient Education  2020 [employer]   

## 2020-08-10 NOTE — Progress Notes (Signed)
Pt here for HROB.

## 2020-08-11 LAB — IRON AND TIBC
Iron Saturation: 16 % (ref 15–55)
Iron: 73 ug/dL (ref 27–159)
Total Iron Binding Capacity: 446 ug/dL (ref 250–450)
UIBC: 373 ug/dL (ref 131–425)

## 2020-08-11 LAB — FERRITIN: Ferritin: 46 ng/mL (ref 15–150)

## 2020-08-11 LAB — VITAMIN B12: Vitamin B-12: 580 pg/mL (ref 232–1245)

## 2020-08-14 ENCOUNTER — Other Ambulatory Visit: Payer: Self-pay | Admitting: Obstetrics and Gynecology

## 2020-08-16 ENCOUNTER — Ambulatory Visit (HOSPITAL_BASED_OUTPATIENT_CLINIC_OR_DEPARTMENT_OTHER): Payer: Medicaid Other | Admitting: Maternal & Fetal Medicine

## 2020-08-16 ENCOUNTER — Other Ambulatory Visit: Payer: Self-pay

## 2020-08-16 ENCOUNTER — Inpatient Hospital Stay: Payer: Medicaid Other

## 2020-08-16 ENCOUNTER — Inpatient Hospital Stay: Payer: Medicaid Other | Attending: Oncology | Admitting: Oncology

## 2020-08-16 ENCOUNTER — Other Ambulatory Visit: Payer: Self-pay | Admitting: Obstetrics and Gynecology

## 2020-08-16 ENCOUNTER — Ambulatory Visit: Payer: Medicaid Other | Attending: Obstetrics and Gynecology

## 2020-08-16 ENCOUNTER — Encounter: Payer: Self-pay | Admitting: Oncology

## 2020-08-16 VITALS — BP 119/69 | HR 8 | Temp 96.6°F | Resp 18 | Ht 67.0 in | Wt 166.3 lb

## 2020-08-16 DIAGNOSIS — Z331 Pregnant state, incidental: Secondary | ICD-10-CM | POA: Diagnosis not present

## 2020-08-16 DIAGNOSIS — Z3A37 37 weeks gestation of pregnancy: Secondary | ICD-10-CM | POA: Diagnosis not present

## 2020-08-16 DIAGNOSIS — Z3A30 30 weeks gestation of pregnancy: Secondary | ICD-10-CM | POA: Diagnosis not present

## 2020-08-16 DIAGNOSIS — Z79899 Other long term (current) drug therapy: Secondary | ICD-10-CM | POA: Insufficient documentation

## 2020-08-16 DIAGNOSIS — D649 Anemia, unspecified: Secondary | ICD-10-CM | POA: Insufficient documentation

## 2020-08-16 DIAGNOSIS — Z803 Family history of malignant neoplasm of breast: Secondary | ICD-10-CM | POA: Diagnosis not present

## 2020-08-16 DIAGNOSIS — F17210 Nicotine dependence, cigarettes, uncomplicated: Secondary | ICD-10-CM | POA: Diagnosis not present

## 2020-08-16 DIAGNOSIS — Z801 Family history of malignant neoplasm of trachea, bronchus and lung: Secondary | ICD-10-CM | POA: Diagnosis not present

## 2020-08-16 DIAGNOSIS — K5900 Constipation, unspecified: Secondary | ICD-10-CM | POA: Diagnosis not present

## 2020-08-16 DIAGNOSIS — R112 Nausea with vomiting, unspecified: Secondary | ICD-10-CM | POA: Insufficient documentation

## 2020-08-16 DIAGNOSIS — O4403 Placenta previa specified as without hemorrhage, third trimester: Secondary | ICD-10-CM | POA: Insufficient documentation

## 2020-08-16 DIAGNOSIS — O99013 Anemia complicating pregnancy, third trimester: Secondary | ICD-10-CM

## 2020-08-16 DIAGNOSIS — Z3403 Encounter for supervision of normal first pregnancy, third trimester: Secondary | ICD-10-CM

## 2020-08-16 DIAGNOSIS — R5383 Other fatigue: Secondary | ICD-10-CM | POA: Insufficient documentation

## 2020-08-16 DIAGNOSIS — O322XX0 Maternal care for transverse and oblique lie, not applicable or unspecified: Secondary | ICD-10-CM | POA: Diagnosis not present

## 2020-08-16 LAB — COMPREHENSIVE METABOLIC PANEL
ALT: 17 U/L (ref 0–44)
AST: 17 U/L (ref 15–41)
Albumin: 3.4 g/dL — ABNORMAL LOW (ref 3.5–5.0)
Alkaline Phosphatase: 70 U/L (ref 38–126)
Anion gap: 8 (ref 5–15)
BUN: 11 mg/dL (ref 6–20)
CO2: 22 mmol/L (ref 22–32)
Calcium: 8.8 mg/dL — ABNORMAL LOW (ref 8.9–10.3)
Chloride: 104 mmol/L (ref 98–111)
Creatinine, Ser: 0.42 mg/dL — ABNORMAL LOW (ref 0.44–1.00)
GFR, Estimated: >60 mL/min (ref >60)
Glucose, Bld: 97 mg/dL (ref 70–99)
Potassium: 3.6 mmol/L (ref 3.5–5.1)
Sodium: 134 mmol/L — ABNORMAL LOW (ref 135–145)
Total Bilirubin: 0.3 mg/dL (ref 0.3–1.2)
Total Protein: 6.9 g/dL (ref 6.5–8.1)

## 2020-08-16 LAB — CBC WITH DIFFERENTIAL/PLATELET
Abs Immature Granulocytes: 0.13 10*3/uL — ABNORMAL HIGH (ref 0.00–0.07)
Basophils Absolute: 0 10*3/uL (ref 0.0–0.1)
Basophils Relative: 0 %
Eosinophils Absolute: 0.1 10*3/uL (ref 0.0–0.5)
Eosinophils Relative: 1 %
HCT: 26.7 % — ABNORMAL LOW (ref 36.0–46.0)
Hemoglobin: 9.1 g/dL — ABNORMAL LOW (ref 12.0–15.0)
Immature Granulocytes: 2 %
Lymphocytes Relative: 21 %
Lymphs Abs: 1.8 10*3/uL (ref 0.7–4.0)
MCH: 31.1 pg (ref 26.0–34.0)
MCHC: 34.1 g/dL (ref 30.0–36.0)
MCV: 91.1 fL (ref 80.0–100.0)
Monocytes Absolute: 0.6 10*3/uL (ref 0.1–1.0)
Monocytes Relative: 7 %
Neutro Abs: 6.1 10*3/uL (ref 1.7–7.7)
Neutrophils Relative %: 69 %
Platelets: 217 10*3/uL (ref 150–400)
RBC: 2.93 MIL/uL — ABNORMAL LOW (ref 3.87–5.11)
RDW: 13.3 % (ref 11.5–15.5)
WBC: 8.7 10*3/uL (ref 4.0–10.5)
nRBC: 0 % (ref 0.0–0.2)

## 2020-08-16 LAB — RETIC PANEL
Immature Retic Fract: 23.5 % — ABNORMAL HIGH (ref 2.3–15.9)
RBC.: 2.98 MIL/uL — ABNORMAL LOW (ref 3.87–5.11)
Retic Count, Absolute: 57.2 10*3/uL (ref 19.0–186.0)
Retic Ct Pct: 1.9 % (ref 0.4–3.1)
Reticulocyte Hemoglobin: 33.5 pg (ref >27.9)

## 2020-08-16 LAB — PATHOLOGIST SMEAR REVIEW

## 2020-08-16 LAB — FOLATE: Folate: 21.7 ng/mL (ref >5.9)

## 2020-08-16 NOTE — Progress Notes (Signed)
Hematology/Oncology Consult note [facility] Telephone:([phone] Fax:([phone]   Patient Care Team: Natale Milch, MD as PCP - General (Obstetrics and Gynecology)  REFERRING PROVIDER: Natale Milch, *  CHIEF COMPLAINTS/REASON FOR VISIT:  Evaluation of anemia during pregnancy  HISTORY OF PRESENTING ILLNESS:   Tammy Frey is a  34 y.o.  female with PMH listed below was seen in consultation at the request of  Jerene Pitch, [name], *  for evaluation of anemia during pregnancy Patient is currently in third trimester pregnancy. She reports feeling nauseated and fatigued. 07/26/2020, hemoglobin 8.9, MCV 91. Vitamin B12 580, iron saturation 16, TIBC 446, ferritin 46 Reviewed patient's previous labs. Anemia is new onset since June 2021 when she had a hemoglobin of 10.1.  Patient had a normal hemoglobin in 2020 She reports a family history of anemia.  Denies any personal history of hemoglobinopathy or any family history of hemoglobinopathy. 04/05/2020, patient's hemoglobinopathy evaluation showed increased hgb F, which may indicate the presence of a hereditary persistence gene,or due to elevated due to pregnancy, porphyria, some malignancies. Patient denies any rectal vaginal bleeding.  Review of Systems  Constitutional: Positive for fatigue. Negative for appetite change, chills and fever.  HENT:   Negative for hearing loss and voice change.   Eyes: Negative for eye problems.  Respiratory: Negative for chest tightness and cough.   Cardiovascular: Negative for chest pain.  Gastrointestinal: Positive for nausea. Negative for abdominal distention, abdominal pain and blood in stool.  Endocrine: Negative for hot flashes.  Genitourinary: Negative for difficulty urinating and frequency.   Musculoskeletal: Negative for arthralgias.  Skin: Negative for itching and rash.  Neurological: Negative for extremity weakness.  Hematological: Negative  for adenopathy.  Psychiatric/Behavioral: Negative for confusion.    MEDICAL HISTORY:  Past Medical History:  Diagnosis Date  . Anemia   . Seizures (HCC)     SURGICAL HISTORY: History reviewed. No pertinent surgical history.  SOCIAL HISTORY: Social History   Socioeconomic History  . Marital status: Single    Spouse name: Not on file  . Number of children: Not on file  . Years of education: Not on file  . Highest education level: Not on file  Occupational History  . Occupation: Film/video editor [employer]   Tobacco Use  . Smoking status: Current Every Day Smoker    Years: 20.00  . Smokeless tobacco: Never Used  . Tobacco comment: 3 cigarettes all day   Vaping Use  . Vaping Use: Former  . Devices: used vaping to quit smoking but did not work   Substance and Sexual Activity  . Alcohol use: Yes    Comment: occasionally- before preganancy   . Drug use: Yes    Types: Marijuana    Comment: last time she smoked was the week of 08/06/20  . Sexual activity: Yes    Birth control/protection: None  Other Topics Concern  . Not on file  Social History Narrative  . Not on file   Social Determinants of Health   Financial Resource Strain:   . Difficulty of Paying Living Expenses: Not on file  Food Insecurity:   . Worried About Programme researcher, broadcasting/film/video in the Last Year: Not on file  . Ran Out of Food in the Last Year: Not on file  Transportation Needs:   . Lack of Transportation (Medical): Not on file  . Lack of Transportation (Non-Medical): Not on file  Physical Activity:   . Days of Exercise per Week: Not on  file  . Minutes of Exercise per Session: Not on file  Stress:   . Feeling of Stress : Not on file  Social Connections:   . Frequency of Communication with Friends and Family: Not on file  . Frequency of Social Gatherings with Friends and Family: Not on file  . Attends Religious Services: Not on file  . Active Member of Clubs or Organizations: Not on file  . Attends Tax inspector Meetings: Not on file  . Marital Status: Not on file  Intimate Partner Violence:   . Fear of Current or Ex-Partner: Not on file  . Emotionally Abused: Not on file  . Physically Abused: Not on file  . Sexually Abused: Not on file    FAMILY HISTORY: Family History  Problem Relation Age of Onset  . Diabetes Father   . Breast cancer Paternal Aunt   . Lung cancer Paternal Uncle   . Cancer Paternal Grandfather     ALLERGIES:  is allergic to haldol [haloperidol].  MEDICATIONS:  Current Outpatient Medications  Medication Sig Dispense Refill  . famotidine (PEPCID) 20 MG tablet Take 20 mg by mouth 2 (two) times daily as needed for heartburn or indigestion.    . Prenatal MV & Min w/FA-DHA (ONE A DAY PRENATAL PO) Take by mouth.     No current facility-administered medications for this visit.     PHYSICAL EXAMINATION: ECOG PERFORMANCE STATUS: 1 - Symptomatic but completely ambulatory Vitals:   08/16/20 0944  BP: 119/69  Pulse: (!) 8  Resp: 18  Temp: (!) 96.6 F (35.9 C)   Filed Weights   08/16/20 0944  Weight: 166 lb 4.8 oz (75.4 kg)    Physical Exam Constitutional:      General: She is not in acute distress. HENT:     Head: Normocephalic and atraumatic.  Eyes:     General: No scleral icterus. Cardiovascular:     Rate and Rhythm: Normal rate and regular rhythm.     Heart sounds: Normal heart sounds.  Pulmonary:     Effort: Pulmonary effort is normal. No respiratory distress.     Breath sounds: No wheezing.  Abdominal:     General: Bowel sounds are normal. There is no distension.     Palpations: Abdomen is soft.     Comments: Gravid uterous  Musculoskeletal:     Cervical back: Normal range of motion and neck supple.     Right lower leg: No edema.     Left lower leg: No edema.  Skin:    General: Skin is warm and dry.     Findings: No erythema or rash.  Neurological:     Mental Status: She is alert and oriented to person, place, and time. Mental  status is at baseline.     Cranial Nerves: No cranial nerve deficit.     Coordination: Coordination normal.  Psychiatric:        Mood and Affect: Mood normal.     LABORATORY DATA:  I have reviewed the data as listed Lab Results  Component Value Date   WBC 8.7 08/16/2020   HGB 9.1 (L) 08/16/2020   HCT 26.7 (L) 08/16/2020   MCV 91.1 08/16/2020   PLT 217 08/16/2020   Recent Labs    03/06/20 0955 06/04/20 1115 08/16/20 1023  NA 134* 136 134*  K 3.4* 3.7 3.6  CL 104 107 104  CO2 23 22 22   GLUCOSE 101* 87 97  BUN 10 8 11   CREATININE 0.70 0.46  0.42*  CALCIUM 8.7* 8.3* 8.8*  GFRNONAA >60 >60 >60  GFRAA >60 >60  --   PROT  --  6.8 6.9  ALBUMIN  --  3.6 3.4*  AST  --  39 17  ALT  --  57* 17  ALKPHOS  --  36* 70  BILITOT  --  0.4 0.3   Iron/TIBC/Ferritin/ %Sat    Component Value Date/Time   IRON 73 08/10/2020 1219   TIBC 446 08/10/2020 1219   FERRITIN 46 08/10/2020 1219   IRONPCTSAT 16 08/10/2020 1219      RADIOGRAPHIC STUDIES: I have personally reviewed the radiological images as listed and agreed with the findings in the report. US OB Transvaginal  Result Date: 07/27/2020 Patient Name: Tammy Frey DOB: 29-Jul-1986 MRN: [id number] ULTRASOUND REPORT Location: [facility] Date of Service: 07/26/2020 Indications:Placental location Findings: Mason Jim intrauterine pregnancy is visualized. Cephalic presentation. + Fetal Heart Tones. Stomach:  visualized Kidneys: visualized Bladder: visualized Transvaginal cervical length performed. Cervical length measures  5.6 cm in the shortest dimension. There is no change with fundal pressure. No funneling is present. There is placenta seen anterior and posterior to the cervical canal. There are placental blood vessels seen crossing over the inner cervical os. Impression: 1. [redacted]w[redacted]d viable Singleton Intrauterine pregnancy by previously established criteria. 2. Cervical length is 5.6 cm. 3. There are placental vessels crossing over  the inner cervical os. Placental tissue is seen anterior and posterior to the cervix. 4. Possible accessory placental lobe. The placental vessels over the cervix appear to be from the anterior portion of the placenta. Recommendations: 1.Clinical correlation with the patient's History and Physical Exam. Deanna Artis, RT Review of ULTRASOUND.    I have personally reviewed images and report of recent ultrasound done at [facility].    Plan of management to be discussed with patient. Annamarie Major, MD, [facility], [facility] 07/27/2020  2:05 PM ]   [place] MFM OB Transvaginal  Result Date: 08/16/2020 ----------------------------------------------------------------------  OBSTETRICS REPORT                       (Signed Final 08/16/2020 04:46 pm) ---------------------------------------------------------------------- Patient Info  ID #:       [id number]                          D.O.B.:  1986-06-07 (34 yrs)  Name:       Tammy Frey              Visit Date: 08/16/2020 03:17 pm ---------------------------------------------------------------------- Performed By  Attending:        Lin Landsman      Ref. Address:     [address]                    [address], [place],  [place] [address]  Performed By:     Francene Boyers           [facility]                                                             [facility]  Referred By:      Natale Milch ---------------------------------------------------------------------- Orders  #  Description                           Code        Ordered By  1  [place] MFM OB DETAIL +14 WK               76811.01    [facility]                                                       SCHUMAN  2  [place] MFM OB TRANSVAGINAL                 76817.2     [facility] ----------------------------------------------------------------------  #  Order #                     Accession #                Episode #  1  [id number]                   [id number]                 [id number]  2  [id number]                   [id number]                 [id number] ---------------------------------------------------------------------- Indications  Placenta previa specified as without           O44.03  hemorrhage, third trimester  Vasa previa                                    O69.4XX0  [redacted] weeks gestation of pregnancy  Z3A.30 ---------------------------------------------------------------------- Fetal Evaluation  Num Of Fetuses:         1  Fetal Heart Rate(bpm):  137  Cardiac Activity:       Observed  Presentation:           Transverse, head to maternal left  Placenta:               Succenturiate lobe, previa  P. Cord Insertion:      Visualized, central  AFI Sum(cm)     %Tile       Largest Pocket(cm)  13.3            41          4.6  RUQ(cm)       RLQ(cm)       LUQ(cm)        LLQ(cm)  4.6           3.6           2.6            2.5 ---------------------------------------------------------------------- Biometry  BPD:        77  mm     G. Age:  30w 6d         44  %    CI:        76.95   %    70 - 86                                                          FL/HC:      20.1   %    19.3 - 21.3  HC:       278   mm     G. Age:  30w 3d         11  %    HC/AC:      1.00        0.96 - 1.17  AC:      276.8  mm     G. Age:  31w 5d         76  %    FL/BPD:     72.5   %    71 - 87  FL:       55.8  mm     G. Age:  29w 3d          8  %    FL/AC:      20.2   %    20 - 24  HUM:      50.1  mm     G. Age:  29w 3d         24  %  CER:        36  mm     G. Age:  30w 0d         13  %  LV:        1.6  mm  CM:        6.5  mm  Est. FW:    1644  gm    3 lb 10 oz      41  %  ---------------------------------------------------------------------- Gestational Age  Clinical EDD:  30w 5d  EDD:   10/20/20  U/S Today:     30w 4d                                        EDD:   10/21/20  Best:          30w 5d     Det. By:  Marcella Dubs         EDD:   10/20/20                                      (03/06/20) ---------------------------------------------------------------------- Anatomy  Cranium:               Appears normal         Aortic Arch:            Appears normal  Cavum:                 Appears normal         Ductal Arch:            Appears normal  Ventricles:            Appears normal         Diaphragm:              Appears normal  Choroid Plexus:        Appears normal         Stomach:                Appears normal, left                                                                        sided  Cerebellum:            Appears normal         Abdomen:                Appears normal  Posterior Fossa:       Appears normal         Abdominal Wall:         Appears nml (cord                                                                        insert, abd wall)  Nuchal Fold:           Appears normal         Cord Vessels:           Appears normal (3  vessel cord)  Face:                  Appears normal         Kidneys:                Appear normal                         (orbits and profile)  Lips:                  Appears normal         Bladder:                Appears normal  Heart:                 Appears normal         Spine:                  Appears normal                         (4CH, axis, and                         situs)  RVOT:                  Appears normal         Upper Extremities:      Appears normal  LVOT:                  Appears normal         Lower Extremities:      Appears normal ---------------------------------------------------------------------- Impression  Tammy Frey is a 34yo  G2P1 who is here in consultation at  the request of Dr. Jerene Pitch regarding possible placenta  previa vs vasa previa.  She is doing well today without complaints. She denies  vaginal bleeding or uterine contractions.  Her pregnancy is dated by a 7 week ultrasound with EDD of  10/20/20.  She had a low risk Mat 21.  Her medical history is uncomplicated. She denies significant  medical, surgical or family history. She occasionally smokes  marijuana to assist in supressing nausea and to improve her  appetite.  She had a prior exam on 07/27/20 with suspected placenta  previa vs vasa previa.  On today's examination we observed normal anatomy with  measurements consistent with dates.  We observed good fetal movement and amniotic fluid volume.  A placenta previa was observed with 2 cm of placental edge  covering the internal with a fetal vessel covering placental  tissue. The placenta appears bilobed but in further evaluation  the placenta is maternal right lateral with a thinning placenta  edge cover the cervix. We interrogated the cervix with  transvaginally and reviwed the images live with Tammy Frey.  I discussed the following regarding a placenta previa We  discussed the fact that placenta previa is associated with an  increased risk of bleeding, inpatient stay, emergency  cesarean delivery, preterm birth, and bleeding precautions  were reviewed. We advised pelvic rest. Should this condition  fail to resolve, we discussed the necessity of cesarean  delivery. Today's sonogram demonstrates no evidence of  invasive placentation. A follow up exam is recommended in 4  weeks.  Finally I recommend delivery between 35-37 weeks if today's  finding persist.  All questions were answered  I spent 30 minute with > 50% in face to face consultation.  I reviewed today's images and recommendation directly with  Dr. Jerene PitchSchuman. ---------------------------------------------------------------------- Recommendations   Follow up growth and placenta views in 4 weeks. ----------------------------------------------------------------------               Lin Landsmanorenthian Booker, MD Electronically Signed Final Report   08/16/2020 04:46 pm ----------------------------------------------------------------------  US MFM OB DETAIL +14 WK  Result Date: 08/16/2020 ----------------------------------------------------------------------  OBSTETRICS REPORT                       (Signed Final 08/16/2020 04:46 pm) ---------------------------------------------------------------------- Patient Info  ID #:       [id number]                          D.O.B.:  1986-08-26 (34 yrs)  Name:       Tammy Frey              Visit Date: 08/16/2020 03:17 pm ---------------------------------------------------------------------- Performed By  Attending:        Lin Landsmanorenthian Booker      Ref. Address:     [address]                    [place]                                                             [address], [place],                                                             [place] [id number]  Performed By:     Francene BoyersAnna Robbins           Location[facility]         [facility] at                                                             [facility]  Referred By:      Natale MilchHRISTANNA R                    SCHUMAN ---------------------------------------------------------------------- Orders  #  Description                           Code        Ordered By  1  US MFM OB DETAIL +14 WK  40981.19    [facility]RISTANNA                                                       SCHUMAN  2  [place] MFM OB TRANSVAGINAL                14782.9     [facility] ----------------------------------------------------------------------  #  Order #                     Accession #                Episode #  1  [id number]                   [id number]                 [id number]  2   [id number]                   [id number]                 [id number] ---------------------------------------------------------------------- Indications  Placenta previa specified as without           O44.03  hemorrhage, third trimester  Vasa previa                                    O69.4XX0  [redacted] weeks gestation of pregnancy                Z3A.30 ---------------------------------------------------------------------- Fetal Evaluation  Num Of Fetuses:         1  Fetal Heart Rate(bpm):  137  Cardiac Activity:       Observed  Presentation:           Transverse, head to maternal left  Placenta:               Succenturiate lobe, previa  P. Cord Insertion:      Visualized, central  AFI Sum(cm)     %Tile       Largest Pocket(cm)  13.3            41          4.6  RUQ(cm)       RLQ(cm)       LUQ(cm)        LLQ(cm)  4.6           3.6           2.6            2.5 ---------------------------------------------------------------------- Biometry  BPD:        77  mm     G. Age:  30w 6d         44  %    CI:        76.95   %    70 - 86  FL/HC:      20.1   %    19.3 - 21.3  HC:       278   mm     G. Age:  30w 3d         11  %    HC/AC:      1.00        0.96 - 1.17  AC:      276.8  mm     G. Age:  31w 5d         76  %    FL/BPD:     72.5   %    71 - 87  FL:       55.8  mm     G. Age:  29w 3d          8  %    FL/AC:      20.2   %    20 - 24  HUM:      50.1  mm     G. Age:  29w 3d         24  %  CER:        36  mm     G. Age:  30w 0d         13  %  LV:        1.6  mm  CM:        6.5  mm  Est. FW:    1644  gm    3 lb 10 oz      41  % ---------------------------------------------------------------------- Gestational Age  Clinical EDD:  30w 5d                                        EDD:   10/20/20  U/S Today:     30w 4d                                        EDD:   10/21/20  Best:          30w 5d     Det. By:  Marcella Dubs         EDD:   10/20/20                                       (03/06/20) ---------------------------------------------------------------------- Anatomy  Cranium:               Appears normal         Aortic Arch:            Appears normal  Cavum:                 Appears normal         Ductal Arch:            Appears normal  Ventricles:            Appears normal         Diaphragm:              Appears normal  Choroid Plexus:        Appears normal         Stomach:  Appears normal, left                                                                        sided  Cerebellum:            Appears normal         Abdomen:                Appears normal  Posterior Fossa:       Appears normal         Abdominal Wall:         Appears nml (cord                                                                        insert, abd wall)  Nuchal Fold:           Appears normal         Cord Vessels:           Appears normal (3                                                                        vessel cord)  Face:                  Appears normal         Kidneys:                Appear normal                         (orbits and profile)  Lips:                  Appears normal         Bladder:                Appears normal  Heart:                 Appears normal         Spine:                  Appears normal                         (4CH, axis, and                         situs)  RVOT:                  Appears normal         Upper Extremities:      Appears normal  LVOT:  Appears normal         Lower Extremities:      Appears normal ---------------------------------------------------------------------- Impression  Tammy Frey is a 34yo  G2P1 who is here in consultation at  the request of Dr. Jerene Pitch regarding possible placenta  previa vs vasa previa.  She is doing well today without complaints. She denies  vaginal bleeding or uterine contractions.  Her pregnancy is dated by a 7 week ultrasound with EDD of  10/20/20.  She had a low risk Mat 21.  Her medical history is  uncomplicated. She denies significant  medical, surgical or family history. She occasionally smokes  marijuana to assist in supressing nausea and to improve her  appetite.  She had a prior exam on 07/27/20 with suspected placenta  previa vs vasa previa.  On today's examination we observed normal anatomy with  measurements consistent with dates.  We observed good fetal movement and amniotic fluid volume.  A placenta previa was observed with 2 cm of placental edge  covering the internal with a fetal vessel covering placental  tissue. The placenta appears bilobed but in further evaluation  the placenta is maternal right lateral with a thinning placenta  edge cover the cervix. We interrogated the cervix with  transvaginally and reviwed the images live with Tammy Frey.  I discussed the following regarding a placenta previa We  discussed the fact that placenta previa is associated with an  increased risk of bleeding, inpatient stay, emergency  cesarean delivery, preterm birth, and bleeding precautions  were reviewed. We advised pelvic rest. Should this condition  fail to resolve, we discussed the necessity of cesarean  delivery. Today's sonogram demonstrates no evidence of  invasive placentation. A follow up exam is recommended in 4  weeks.  Finally I recommend delivery between 35-37 weeks if today's  finding persist.  All questions were answered  I spent 30 minute with > 50% in face to face consultation.  I reviewed today's images and recommendation directly with  Dr. Jerene Pitch. ---------------------------------------------------------------------- Recommendations  Follow up growth and placenta views in 4 weeks. ----------------------------------------------------------------------               Lin Landsman, MD Electronically Signed Final Report   08/16/2020 04:46 pm ----------------------------------------------------------------------     ASSESSMENT & PLAN:  1. Anemia during pregnancy in third trimester      #Labs were reviewed and discussed with patient. Anemia in pregnancy Iron panel is not consistent with severe iron deficiency. Normal vitamin B12 level. I recommend patient to check CBC, pathology smear, MMA level, folate for further evaluation - TSAT is <20, I discussed with her about option of oral iron supplementation vs IV iron infusion. Allergy reactions/infusion reaction including anaphylactic reaction discussed with patient. Other side effects include but not limited to high blood pressure, skin rash, weight gain, leg swelling, etc. Option of oral iron supplementation was also discussed. She has constipation and I recommend her to try colace 100mg  daily with iron supplementation.She agrees with the plan.   Mildly Increased hemoglobin F ? Hereditary persistence of fetal hemoglobin versus elevation due to pregnancy or other condition. Retesting after delivery or molecular testing.     Orders Placed This Encounter  Procedures  . CBC with Differential/Platelet    Standing Status:   Future    Number of Occurrences:   1    Standing Expiration Date:   08/16/2021  . Pathologist smear review    Standing Status:   Future    Number of Occurrences:  1    Standing Expiration Date:   08/16/2021  . Methylmalonic acid, serum    Standing Status:   Future    Number of Occurrences:   1    Standing Expiration Date:   08/16/2021  . Retic Panel    Standing Status:   Future    Number of Occurrences:   1    Standing Expiration Date:   08/16/2021  . Comprehensive metabolic panel    Standing Status:   Future    Number of Occurrences:   1    Standing Expiration Date:   08/16/2021  . Folate    Standing Status:   Future    Number of Occurrences:   1    Standing Expiration Date:   08/16/2021    All questions were answered. The patient knows to call the clinic with any problems questions or concerns.  cc Schuman, [name], *    Return of visit: to be determined.  Thank you for this  kind referral and the opportunity to participate in the care of this patient. A copy of today's note is routed to referring provider    Rickard Patience, MD, PhD Hematology Oncology [facility] at [facility] Pager- [phone] 08/16/2020

## 2020-08-16 NOTE — Progress Notes (Signed)
Pt here to establish care for anemia in pregnancy, third trimester. Pt currently [redacted] weeks pregnant.

## 2020-08-16 NOTE — Progress Notes (Signed)
MFM Consultation  Ms. Laughner is a 34yo  G2P1 who is here in consultation at the request of Dr. Jerene Pitch regarding possible placenta previa vs vasa previa.  She is doing well today without complaints. She denies vaginal bleeding or uterine contractions.  Her pregnancy is dated by a 7 week ultrasound with EDD of 10/20/20.  She had a low risk Mat 21.  Her medical history is uncomplicated. She denies significant medical, surgical or family history. She occasionally smokes marijuana to assist in supressing nausea and to improve her appetite.   She had a prior exam on 07/27/20 with suspected placenta previa vs vasa previa.   On today's examination we observed normal anatomy with measurements consistent with dates. We observed good fetal movement and amniotic fluid volume.  A placenta previa was observed with ~2 cm of placental edge covering the internal with a fetal vessel covering placental tissue. The placenta appears bilobed but in further evaluation the placenta is maternal right lateral with a thinning placenta edge cover the cervix. We interrogated the cervix with transvaginally and reviwed the images live with Ms. Alferd Patee.   I discussed the following regarding a placenta previa We discussed the fact that placenta previa is associated with an increased risk of bleeding, inpatient stay, emergency cesarean delivery, preterm birth, and bleeding precautions were reviewed. We advised pelvic rest. Should this condition fail to resolve, we discussed the necessity of cesarean delivery. Today's sonogram demonstrates no evidence of invasive placentation. A follow up exam is recommended in 4 weeks.   Finally I recommend delivery between 35-37 weeks if today's finding persist.  Follow up growth and placental location in 4 weeks.    All questions were answered  I spent 30 minute with > 50% in face to face consultation.   I reviewed today's images and recommendation directly with Dr. Jerene Pitch.

## 2020-08-20 LAB — METHYLMALONIC ACID, SERUM: Methylmalonic Acid, Quantitative: 145 nmol/L (ref 0–378)

## 2020-08-22 ENCOUNTER — Telehealth: Payer: Self-pay

## 2020-08-22 DIAGNOSIS — O99013 Anemia complicating pregnancy, third trimester: Secondary | ICD-10-CM

## 2020-08-22 NOTE — Telephone Encounter (Signed)
-----   Message from Rickard Patience, MD sent at 08/21/2020 10:32 PM EST ----- Please arrange her to do follow up Lab cbc iron tibc ferritin prior, MD +/- Venofer first week of December.

## 2020-08-22 NOTE — Telephone Encounter (Signed)
Done Appts has been sched as requested Pt was made aware 

## 2020-08-22 NOTE — Telephone Encounter (Signed)
Mychart message sent notifying pt of follow up plan.

## 2020-08-23 ENCOUNTER — Encounter: Payer: Self-pay | Admitting: Advanced Practice Midwife

## 2020-08-23 ENCOUNTER — Other Ambulatory Visit: Payer: Self-pay

## 2020-08-23 ENCOUNTER — Ambulatory Visit (INDEPENDENT_AMBULATORY_CARE_PROVIDER_SITE_OTHER): Payer: Medicaid Other | Admitting: Advanced Practice Midwife

## 2020-08-23 VITALS — BP 125/74 | Wt 165.0 lb

## 2020-08-23 DIAGNOSIS — Z23 Encounter for immunization: Secondary | ICD-10-CM | POA: Diagnosis not present

## 2020-08-23 DIAGNOSIS — Z3A31 31 weeks gestation of pregnancy: Secondary | ICD-10-CM

## 2020-08-23 DIAGNOSIS — O4403 Placenta previa specified as without hemorrhage, third trimester: Secondary | ICD-10-CM

## 2020-08-23 DIAGNOSIS — O0993 Supervision of high risk pregnancy, unspecified, third trimester: Secondary | ICD-10-CM

## 2020-08-23 LAB — POCT URINALYSIS DIPSTICK OB
Glucose, UA: NEGATIVE
POC,PROTEIN,UA: NEGATIVE

## 2020-08-23 NOTE — Progress Notes (Signed)
Routine Prenatal Care Visit  Subjective  Tammy Frey is a 34 y.o. [phone] at [redacted]w[redacted]d being seen today for ongoing prenatal care.  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  ----------------------------------------------------------------------------------- Patient reports no complaints. She reports feeling well. She has follow up visit scheduled with hematology for repeat lab work and to determine if infusion is necessary vs continue taking PO iron. She has follow up visit scheduled with MFM regarding placenta and to determine timing of delivery. We discussed various medications she may/will have including IV magnesium, betamethasone, terbutaline. She is aware of precautions. Contractions: Not present. Vag. Bleeding: None.  Movement: Present. Leaking Fluid denies.  ----------------------------------------------------------------------------------- The following portions of the patient's history were reviewed and updated as appropriate: allergies, current medications, past family history, past medical history, past social history, past surgical history and problem list. Problem list updated.  Objective  Blood pressure 125/74, weight 165 lb (74.8 kg). Pregravid weight 148 lb (67.1 kg) Total Weight Gain 17 lb (7.711 kg) Urinalysis: Urine Protein Negative  Urine Glucose Negative  Fetal Status: Fetal Heart Rate (bpm): 152 Fundal Height: 32 cm Movement: Present     General:  Alert, oriented and cooperative. Patient is in no acute distress.  Skin: Skin is warm and dry. No rash noted.   Cardiovascular: Normal heart rate noted  Respiratory: Normal respiratory effort, no problems with respiration noted  Abdomen: Soft, gravid, appropriate for gestational age. Pain/Pressure: Absent     Pelvic:  Cervical exam deferred         Extremities: Normal range of motion.  Edema: None  Mental Status: Normal mood and affect. Normal behavior. Normal judgment and thought content.   Assessment   34 y.o. [id number] at [redacted]w[redacted]d by  10/20/2020, by Ultrasound presenting for routine prenatal visit  Plan   Pregnancy#3 Problems (from 01/14/20 to present)    Problem Noted Resolved   Supervision of normal pregnancy 03/09/2020 by Tresea Mall, CNM No   Overview Addendum 08/10/2020 12:08 PM by Natale Milch, MD    Clinic [facility] Prenatal Labs  Dating EDD by 7w u/s Blood type:     Genetic Screen 1 Screen:    AFP:     Quad:     NIPS: Antibody:   Anatomic [place]  Rubella:   Varicella: @VZVIGG @  GTT Early: NA              Third trimester:  RPR:     Rhogam  HBsAg:     Vaccines TDAP: 08/23/20                      Flu Shot: HIV:     Baby Food Formula                               GBS:   GC/CT:  Contraception BTL consent signed 11/18 Pap: 03/08/2020:  CBB     CS/VBAC NA   Support Person Partner John     Nursing Staff Provider  Office Location  [facility] Dating    Language  English Anatomy 05/08/2020    Flu Vaccine  07/26/2020 Genetic Screen  NIPS:  Normal, gender surprise   TDaP vaccine  08/23/20 Hgb A1C or  GTT Early : Third trimester : normal  Rhogam   not needed  LAB RESULTS   Feeding Plan formula Blood Type O/Positive/-- (07/01 1447)   Contraception Tubal Antibody Negative (07/01 1447)  Circumcision  Rubella 2.18 (07/01 1447)  Pediatrician   RPR Non Reactive (10/21 1537)   Support Person  partner John HBsAg Negative (07/01 1447)   Prenatal Classes  HIV Non Reactive (10/21 1537)    Varicella immune  BTL Consent 08/23/20 GBS  (For PCN allergy, check sensitivities)        VBAC Consent  Pap      Hgb Electro      CF      SMA          High Risk pregnancy Diagnoses Vasa previa vs. Placenta previa- MFM referral Anemia in pregnancy, persistent hgb F- hematology referral placed        Previous Version    TDAP today Blood  Transfusion consent signed today Bilateral Tubal Ligation consent signed today   Preterm labor symptoms and general obstetric precautions including but not limited to vaginal bleeding, contractions, leaking of fluid and fetal movement were reviewed in detail with the patient. Please refer to After Visit Summary for other counseling recommendations.   Return in about 2 weeks (around 09/06/2020) for rob.  Tresea Mall, CNM 08/23/2020 10:57 AM

## 2020-08-23 NOTE — Progress Notes (Signed)
Blood consent, BTL, no other concerns

## 2020-08-23 NOTE — Progress Notes (Signed)
Blood consent, BTL, TDAP. no other concerns. RM 3

## 2020-09-01 ENCOUNTER — Other Ambulatory Visit: Payer: Self-pay

## 2020-09-01 ENCOUNTER — Observation Stay
Admission: EM | Admit: 2020-09-01 | Discharge: 2020-09-02 | Disposition: A | Discharge status: Home / Self Care | Payer: Medicaid Other | Attending: Obstetrics and Gynecology | Admitting: Obstetrics and Gynecology

## 2020-09-01 ENCOUNTER — Encounter: Payer: Self-pay | Admitting: Obstetrics and Gynecology

## 2020-09-01 DIAGNOSIS — O479 False labor, unspecified: Secondary | ICD-10-CM | POA: Diagnosis present

## 2020-09-01 DIAGNOSIS — Z3A33 33 weeks gestation of pregnancy: Secondary | ICD-10-CM

## 2020-09-01 DIAGNOSIS — O4403 Placenta previa specified as without hemorrhage, third trimester: Secondary | ICD-10-CM | POA: Diagnosis not present

## 2020-09-01 DIAGNOSIS — O4703 False labor before 37 completed weeks of gestation, third trimester: Secondary | ICD-10-CM

## 2020-09-01 DIAGNOSIS — F17210 Nicotine dependence, cigarettes, uncomplicated: Secondary | ICD-10-CM | POA: Insufficient documentation

## 2020-09-01 DIAGNOSIS — Z20822 Contact with and (suspected) exposure to covid-19: Secondary | ICD-10-CM | POA: Insufficient documentation

## 2020-09-01 DIAGNOSIS — O26893 Other specified pregnancy related conditions, third trimester: Secondary | ICD-10-CM | POA: Diagnosis present

## 2020-09-01 LAB — RESP PANEL BY RT-PCR (FLU A&B, COVID) ARPGX2
Influenza A by PCR: NEGATIVE
Influenza B by PCR: NEGATIVE
SARS Coronavirus 2 by RT PCR: NEGATIVE

## 2020-09-01 LAB — CBC
HCT: 23.8 % — ABNORMAL LOW (ref 36.0–46.0)
Hemoglobin: 8 g/dL — ABNORMAL LOW (ref 12.0–15.0)
MCH: 31.1 pg (ref 26.0–34.0)
MCHC: 33.6 g/dL (ref 30.0–36.0)
MCV: 92.6 fL (ref 80.0–100.0)
Platelets: 201 10*3/uL (ref 150–400)
RBC: 2.57 MIL/uL — ABNORMAL LOW (ref 3.87–5.11)
RDW: 13.1 % (ref 11.5–15.5)
WBC: 9.1 10*3/uL (ref 4.0–10.5)
nRBC: 0 % (ref 0.0–0.2)

## 2020-09-01 LAB — TYPE AND SCREEN
ABO/RH(D): O POS
Antibody Screen: NEGATIVE

## 2020-09-01 MED ORDER — LACTATED RINGERS IV BOLUS
1000.0000 mL | Freq: Once | INTRAVENOUS | Status: AC
  Administered 2020-09-01: 1000 mL via INTRAVENOUS

## 2020-09-01 MED ORDER — ZOLPIDEM TARTRATE 5 MG PO TABS
5.0000 mg | ORAL_TABLET | Freq: Every evening | ORAL | Status: DC | PRN
  Administered 2020-09-01: 5 mg via ORAL
  Filled 2020-09-01: qty 1

## 2020-09-01 MED ORDER — TERBUTALINE SULFATE 1 MG/ML IJ SOLN
INTRAMUSCULAR | Status: AC
  Filled 2020-09-01: qty 1

## 2020-09-01 MED ORDER — BETAMETHASONE SOD PHOS & ACET 6 (3-3) MG/ML IJ SUSP
12.0000 mg | Freq: Once | INTRAMUSCULAR | Status: AC
  Administered 2020-09-01: 12 mg via INTRAMUSCULAR
  Filled 2020-09-01: qty 5

## 2020-09-01 MED ORDER — TERBUTALINE SULFATE 1 MG/ML IJ SOLN
0.2500 mg | Freq: Once | INTRAMUSCULAR | Status: AC
  Administered 2020-09-01: 0.25 mg via SUBCUTANEOUS

## 2020-09-01 MED ORDER — LACTATED RINGERS IV SOLN: INTRAVENOUS | Status: DC

## 2020-09-01 NOTE — Discharge Summary (Signed)
Please see Final progress note  Mirna Mires, CNM  09/01/2020 9:34 PM

## 2020-09-01 NOTE — H&P (Signed)
History and Physical  Chief complaint: uterine contractions  Tammy Frey is an 34 y.o. female.  HPI: She presents today to L&D with complaints of uterine contractions. She reports that she has been having contractions intermittently throughout the day. She was cleaning and doing chores around the home. She reports that around 1:30 am her contractions worsened and were close together occurring every 2-3 minutes. She denies vaginal bleeding. She reports a milky white discharge. She felt a stabbing vaginal pain.  Her pregnancy has been complicated by a vasa previa with an overriding placental vessel.   Pregnancy#3 Problems (from 01/14/20 to present)    Problem Noted Resolved   Placenta previa antepartum in third trimester 08/23/2020 by Tresea Mall, CNM No   Supervision of high risk pregnancy, antepartum 03/09/2020 by Tresea Mall, CNM No   Overview Addendum 08/23/2020 11:06 AM by Tresea Mall, CNM     Nursing Staff Provider  Office Location  [facility] Dating   7 wk [place]  Language  English Anatomy US   complete  Flu Vaccine  07/26/2020 Genetic Screen  NIPS:  Normal, gender surprise   TDaP vaccine  08/23/20  Hgb A1C or  GTT Third trimester : normal  Rhogam   not needed   LAB RESULTS   Feeding Plan formula Blood Type O/Positive/-- (07/01 1447)   Contraception BTL consent signed 11/18 Antibody Negative (07/01 1447)  Circumcision  Rubella 2.18 (07/01 1447)  Pediatrician   RPR Non Reactive (10/21 1537)   Support Person  partner John HBsAg Negative (07/01 1447)   Prenatal Classes  HIV Non Reactive (10/21 1537)    Varicella immune  BTL Consent Signed 11/18 GBS  (For PCN allergy, check sensitivities)        VBAC Consent  Pap  2021 NIL    Hgb Electro   abnormal    CF      SMA          High Risk pregnancy Diagnoses Vasa previa vs. Placenta previa- MFM referral Anemia in pregnancy, persistent hgb F- hematology referral, receiving iron infusions        Previous Version        Past Medical History:  Diagnosis Date  . Anemia   . Seizures (HCC)     No past surgical history on file.  Family History  Problem Relation Age of Onset  . Diabetes Father   . Breast cancer Paternal Aunt   . Lung cancer Paternal Uncle   . Cancer Paternal Grandfather     Social History:  reports that she has been smoking. She has smoked for the past 20.00 years. She has never used smokeless tobacco. She reports previous alcohol use. She reports current drug use. Drug: Marijuana.  Allergies:  Allergies  Allergen Reactions  . Haldol [Haloperidol]     Dystonic reaction    Medications: I have reviewed the patient's current medications.  No results found for this or any previous visit (from the past 48 hour(s)).  No results found.  Review of Systems  Constitutional: Negative for chills and fever.  HENT: Negative for congestion, hearing loss and sinus pain.   Respiratory: Negative for cough, shortness of breath and wheezing.   Cardiovascular: Negative for chest pain, palpitations and leg swelling.  Gastrointestinal: Negative for abdominal pain, constipation, diarrhea, nausea and vomiting.  Genitourinary: Negative for dysuria, flank pain, frequency, hematuria and urgency.  Musculoskeletal: Negative for back pain.  Skin: Negative for rash.  Neurological: Negative for dizziness and headaches.  Psychiatric/Behavioral: Negative  for suicidal ideas. The patient is not nervous/anxious.    Blood pressure 128/61, pulse 73, temperature 97.8 F (36.6 C), temperature source Oral, resp. rate 18, height 5\' 7"  (1.702 m), weight 74.8 kg, SpO2 100 %. Physical Exam Vitals and nursing note reviewed.  Constitutional:      Appearance: She is well-developed.  HENT:     Head: Normocephalic and atraumatic.  Cardiovascular:     Rate and Rhythm: Normal rate and regular rhythm.  Pulmonary:     Effort: Pulmonary effort is normal.     Breath sounds: Normal breath sounds.  Abdominal:      General: Bowel sounds are normal.     Palpations: Abdomen is soft.  Musculoskeletal:        General: Normal range of motion.  Skin:    General: Skin is warm and dry.  Neurological:     Mental Status: She is alert and oriented to person, place, and time.  Psychiatric:        Behavior: Behavior normal.        Thought Content: Thought content normal.        Judgment: Judgment normal.    NST: 150 bpm baseline, moderate variability, 15x15 accelerations, n0decelerations. Tocometer : every 2-3 minutes   Assessment/Plan: 34 yo G4P1021 [redacted]w[redacted]d Will start IV fluid and single dose terbutaline.  Betamethasone now.  Will monitor closely. Will consider cesarean delivery if any changes.   Brodric Schauer R Dionisia Pacholski 09/01/2020, 5:17 AM

## 2020-09-01 NOTE — OB Triage Note (Signed)
Pt is 34 year old G3P1 at 11 weeks with known placenta previa. Pt states UCs since 0100. Pt states no bleeding but did notice some whitish discharge. Pt is very anxious and worried about bleeding. States MD told her there was a blood vessel very close to her placenta. Pt states no BP or BS problems. VSS +FM and FHR 170

## 2020-09-01 NOTE — Progress Notes (Signed)
Tammy Frey is a 34 y.o. [phone] at [redacted]w[redacted]d by ultrasound admitted for observation due to mild contractions in the face of placenta previa vs accessory lobe.  She reported to Labor and Delivery early today, and was seen by Dr. Jerene Pitch. She is being monitored, and has received one dose of BMZ.  She reports occasional mild contractions, but denies any ongoing contractions pain. She has been napping. She denies any LOF or vaginal bleeding. Her baby is moving well.  Subjective: She is calm and deniesn any painful cramping. Has been taling on the phone and watrching television.   Objective: BP (!) 112/55 (BP Location: Left Arm)   Pulse 94   Temp 98 F (36.7 C)   Resp 18   Ht 5\' 7"  (1.702 m)   Wt 74.8 kg   LMP  (LMP Unknown)   SpO2 100%   BMI 25.84 kg/m  No intake/output data recorded. No intake/output data recorded.  FHT:  FHR: 140 baseline  bpm, variability: moderate,  accelerations:  Present,  decelerations:  Present ocassional brief variables noted. variablity is moderate to marked with fetal movement. UC:   Mild , irregular, with periods of uterine irritability. SVE:     deferred Labs: Lab Results  Component Value Date   WBC 9.1 09/01/2020   HGB 8.0 (L) 09/01/2020   HCT 23.8 (L) 09/01/2020   MCV 92.6 09/01/2020   PLT 201 09/01/2020    Assessment / Plan: observation at [redacted] weeks gestation. Fetal Wellbeing:  Category II  Will continue on EFM. Anticipate discharge home tomorrow morning after her second dose of BMZ, as long as she is not contracting. She is aware that delivery will be for planned CS at 36-37 weeks per MFM. She already has follow up already planned at [facility] and with MFM.  }Keldan Eplin [facility] 09/01/2020, 4:43 PM

## 2020-09-01 NOTE — Final Progress Note (Signed)
Physician Final Progress Note  Patient ID: Tammy Frey MRN: [id number] DOB/AGE: October 03, 1986 34 y.o.  Admit date: 09/01/2020 Admitting provider: Natale Milch, MD Discharge date: 1128/2021   Admission Diagnoses: [redacted] weeks gestation of pregnancy Preterm contractions  Discharge Diagnoses:  Active Problems:   Uterine contractions  Reassuring fetal heart tones Preterm labor  Consults: None  Significant Findings/ Diagnostic Studies: EFM indicates utrine irritability and some preterm contractions Placenta previa  Procedures: EFM, NST, betamethasone injections, IV fluid hydration  Discharge Condition: good  Disposition: Discharge disposition: 01-Home or Self Care      Home in care of family Diet: Regular diet  Discharge Activity: pelvic rest, ample po hydration, monitor for any return of contractions. Follow up planned in several days at [facility] and with the Maternal Fetal Medicine. Preterm labor precautions provided.  Discharge Instructions    Discharge activity:  No Restrictions   Complete by: As directed    Discharge diet:  No restrictions   Complete by: As directed    Do not have sex or do anything that might make you have an orgasm   Complete by: As directed    Fetal Kick Count:  Lie on our left side for one hour after a meal, and count the number of times your baby kicks.  If it is less than 5 times, get up, move around and drink some juice.  Repeat the test 30 minutes later.  If it is still less than 5 kicks in an hour, notify your doctor.   Complete by: As directed    Notify physician for a general feeling that "something is not right"   Complete by: As directed    Notify physician for increase or change in vaginal discharge   Complete by: As directed    Notify physician for intestinal cramps, with or without diarrhea, sometimes described as "gas pain"   Complete by: As directed    Notify physician for leaking of fluid   Complete by: As  directed    Notify physician for low, dull backache, unrelieved by heat or Tylenol   Complete by: As directed    Notify physician for menstrual like cramps   Complete by: As directed    Notify physician for pelvic pressure   Complete by: As directed    Notify physician for uterine contractions.  These may be painless and feel like the uterus is tightening or the baby is  "balling up"   Complete by: As directed    Notify physician for vaginal bleeding   Complete by: As directed    PRETERM LABOR:  Includes any of the follwing symptoms that occur between 20 - [redacted] weeks gestation.  If these symptoms are not stopped, preterm labor can result in preterm delivery, placing your baby at risk   Complete by: As directed      Allergies as of 09/01/2020      Reactions   Haldol [haloperidol]    Dystonic reaction      Medication List    TAKE these medications   docusate sodium 100 MG capsule Commonly known as: COLACE Take 100 mg by mouth daily.   famotidine 20 MG tablet Commonly known as: PEPCID Take 20 mg by mouth 2 (two) times daily as needed for heartburn or indigestion.   ferrous sulfate 325 (65 FE) MG EC tablet Take 325 mg by mouth daily with breakfast.   ONE A DAY PRENATAL PO Take by mouth.  Total time spent taking care of this patient: 35 minutes  Signed: Mirna Mires 09/01/2020, 9:40 PM

## 2020-09-02 DIAGNOSIS — F17210 Nicotine dependence, cigarettes, uncomplicated: Secondary | ICD-10-CM | POA: Diagnosis not present

## 2020-09-02 DIAGNOSIS — Z20822 Contact with and (suspected) exposure to covid-19: Secondary | ICD-10-CM | POA: Diagnosis not present

## 2020-09-02 DIAGNOSIS — Z3A33 33 weeks gestation of pregnancy: Secondary | ICD-10-CM | POA: Diagnosis not present

## 2020-09-02 MED ORDER — BETAMETHASONE SOD PHOS & ACET 6 (3-3) MG/ML IJ SUSP
12.0000 mg | Freq: Once | INTRAMUSCULAR | Status: AC
  Administered 2020-09-02: 12 mg via INTRAMUSCULAR
  Filled 2020-09-02: qty 5

## 2020-09-04 ENCOUNTER — Observation Stay
Admission: EM | Admit: 2020-09-04 | Discharge: 2020-09-04 | Disposition: A | Discharge status: Home / Self Care | Payer: Medicaid Other | Attending: Obstetrics and Gynecology | Admitting: Obstetrics and Gynecology

## 2020-09-04 ENCOUNTER — Other Ambulatory Visit: Payer: Self-pay

## 2020-09-04 DIAGNOSIS — O4703 False labor before 37 completed weeks of gestation, third trimester: Secondary | ICD-10-CM

## 2020-09-04 DIAGNOSIS — Z3A33 33 weeks gestation of pregnancy: Secondary | ICD-10-CM | POA: Diagnosis not present

## 2020-09-04 DIAGNOSIS — O4403 Placenta previa specified as without hemorrhage, third trimester: Secondary | ICD-10-CM

## 2020-09-04 DIAGNOSIS — O4700 False labor before 37 completed weeks of gestation, unspecified trimester: Secondary | ICD-10-CM | POA: Diagnosis present

## 2020-09-04 LAB — FETAL FIBRONECTIN: Fetal Fibronectin: NEGATIVE

## 2020-09-04 NOTE — OB Triage Note (Signed)
Pt reports ctx that started overnight and have not been consistent but often enough to keep her up all night. Denies vaginal bleeding or leaking fluid. Baby movement present and felt. Pt reports low lying placenta and spots in her vision.

## 2020-09-04 NOTE — H&P (Signed)
H&P Chief complaint: uterine contractions  Tammy Frey is an 34 y.o. female.  HPI: patient presents to labor and delivery with complaints of contractions which started overnight and worsened.  She reports that she contracted throughout the night was not able to sleep.  Her mother saw that she was having contractions through her teacher and encouraged the patient to come to labor and delivery for further evaluation.  She has not had any leakage of fluid.  She is feeling normal fetal movement.  She was seen recently and given betamethasone related to preterm contractions as well.  Past Medical History:  Diagnosis Date  . Anemia   . Seizures (HCC)     No past surgical history on file.  Family History  Problem Relation Age of Onset  . Diabetes Father   . Breast cancer Paternal Aunt   . Lung cancer Paternal Uncle   . Cancer Paternal Grandfather     Social History:  reports that she has been smoking. She has smoked for the past 20.00 years. She has never used smokeless tobacco. She reports previous alcohol use. She reports current drug use. Drug: Marijuana.  Allergies:  Allergies  Allergen Reactions  . Haldol [Haloperidol]     Dystonic reaction    Medications: I have reviewed the patient's current medications.  No results found for this or any previous visit (from the past 48 hour(s)).  No results found.  Review of Systems  Constitutional: Negative for chills and fever.  HENT: Negative for congestion, hearing loss and sinus pain.   Respiratory: Negative for cough, shortness of breath and wheezing.   Cardiovascular: Negative for chest pain, palpitations and leg swelling.  Gastrointestinal: Negative for abdominal pain, constipation, diarrhea, nausea and vomiting.  Genitourinary: Negative for dysuria, flank pain, frequency, hematuria and urgency.  Musculoskeletal: Negative for back pain.  Skin: Negative for rash.  Neurological: Negative for dizziness and headaches.   Psychiatric/Behavioral: Negative for suicidal ideas. The patient is not nervous/anxious.    Blood pressure 112/61, pulse 79, temperature 98.3 F (36.8 C), temperature source Oral, resp. rate 16. Physical Exam Vitals and nursing note reviewed.  Constitutional:      Appearance: She is well-developed.  HENT:     Head: Normocephalic and atraumatic.  Cardiovascular:     Rate and Rhythm: Normal rate and regular rhythm.  Pulmonary:     Effort: Pulmonary effort is normal.     Breath sounds: Normal breath sounds.  Abdominal:     General: Bowel sounds are normal.     Palpations: Abdomen is soft.  Musculoskeletal:        General: Normal range of motion.  Skin:    General: Skin is warm and dry.  Neurological:     Mental Status: She is alert and oriented to person, place, and time.  Psychiatric:        Behavior: Behavior normal.        Thought Content: Thought content normal.        Judgment: Judgment normal.    NST: 135 bpm baseline, moderate variability, 15x15 accelerations, no decelerations. Tocometer : quiet   Assessment/Plan: 34 year old G4, P1 at 33 weeks 3 days gestation. With placenta previa and uterine contractions Patient reports that her contractions are largely improving. FFN was collected and was negative. Patient has planned follow-up later this week with MFM.  She still needs a C-section to be scheduled.  Tentative plan is for delivery between 35 and [redacted] weeks gestation.  We will plan normal follow-up  with the patient.  Reassurance given.  Encourage patient to continue regular hydration at home.  Encourage patient return urgently if she has any bleeding.    Pallie Swigert R Sunaina Ferrando 09/04/2020, 9:56 AM

## 2020-09-04 NOTE — Discharge Summary (Signed)
Physician Discharge Summary   Patient ID: Tammy Frey [id number] 34 y.o. 1986-01-21  Admit date: 09/04/2020  Discharge date and time: No discharge date for patient encounter.   Admitting Physician: Tammy Milch, MD   Discharge Physician: Tammy Idler MD  Admission Diagnoses: Preterm contractions [O47.00]  Discharge Diagnoses: Preterm contractiosn  Admission Condition: good  Discharged Condition: good  Indication for Admission: evaluation of preterm contractions  Hospital Course: Patient was evaluated in triage for preterm contractions.  Patient reports that her contractions largely improved when she reached the floor.  She was noted to have irregular intermittent contractions on the monitor but was not having regular contractions.  She FFM was collected and was negative.  Cervix was not checked because of placenta previa.  No uterine or vaginal bleeding was noted.  Patient was able to be discharged home in stable condition after reassurance.  Consults: None  Significant Diagnostic Studies: labs: FFN  Treatments: none  Discharge Exam: BP 112/61 (BP Location: Left Arm)   Pulse 79   Temp 98.3 F (36.8 C) (Oral)   Resp 16   LMP  (LMP Unknown)   General Appearance:    Alert, cooperative, no distress, appears stated age  Head:    Normocephalic, without obvious abnormality, atraumatic  Eyes:    PERRL, conjunctiva/corneas clear, EOM's intact, fundi    benign, both eyes  Ears:    Normal TM's and external ear canals, both ears  Nose:   Nares normal, septum midline, mucosa normal, no drainage    or sinus tenderness  Throat:   Lips, mucosa, and tongue normal; teeth and gums normal  Neck:   Supple, symmetrical, trachea midline, no adenopathy;    thyroid:  no enlargement/tenderness/nodules; no carotid   bruit or JVD  Back:     Symmetric, no curvature, ROM normal, no CVA tenderness  Lungs:     Clear to auscultation bilaterally, respirations unlabored  Chest  Wall:    No tenderness or deformity   Heart:    Regular rate and rhythm, S1 and S2 normal, no murmur, rub   or gallop  Breast Exam:    No tenderness, masses, or nipple abnormality  Abdomen:     Soft, non-tender, bowel sounds active all four quadrants,    no masses, no organomegaly  Genitalia:    Normal female without lesion, discharge or tenderness  Rectal:    Normal tone, normal prostate, no masses or tenderness;   guaiac negative stool  Extremities:   Extremities normal, atraumatic, no cyanosis or edema  Pulses:   2+ and symmetric all extremities  Skin:   Skin color, texture, turgor normal, no rashes or lesions  Lymph nodes:   Cervical, supraclavicular, and axillary nodes normal  Neurologic:   CNII-XII intact, normal strength, sensation and reflexes    throughout    Disposition: Discharge disposition: 01-Home or Self Care       Patient Instructions:  Allergies as of 09/04/2020      Reactions   Haldol [haloperidol]    Dystonic reaction      Medication List    TAKE these medications   docusate sodium 100 MG capsule Commonly known as: COLACE Take 100 mg by mouth daily.   famotidine 20 MG tablet Commonly known as: PEPCID Take 20 mg by mouth 2 (two) times daily as needed for heartburn or indigestion.   ferrous sulfate 325 (65 FE) MG EC tablet Take 325 mg by mouth daily with breakfast.   ONE A DAY PRENATAL  PO Take by mouth.      Activity: activity as tolerated Diet: regular diet Wound Care: none needed  Follow-up with [facility] as planned  Signed: [name] 09/04/2020 11:21 AM

## 2020-09-05 DIAGNOSIS — Z419 Encounter for procedure for purposes other than remedying health state, unspecified: Secondary | ICD-10-CM | POA: Diagnosis not present

## 2020-09-06 ENCOUNTER — Encounter: Payer: Self-pay | Admitting: Advanced Practice Midwife

## 2020-09-06 ENCOUNTER — Other Ambulatory Visit: Payer: Self-pay

## 2020-09-06 ENCOUNTER — Ambulatory Visit (INDEPENDENT_AMBULATORY_CARE_PROVIDER_SITE_OTHER): Payer: Medicaid Other | Admitting: Advanced Practice Midwife

## 2020-09-06 VITALS — BP 126/74 | Wt 173.0 lb

## 2020-09-06 DIAGNOSIS — O4403 Placenta previa specified as without hemorrhage, third trimester: Secondary | ICD-10-CM

## 2020-09-06 DIAGNOSIS — Z3A33 33 weeks gestation of pregnancy: Secondary | ICD-10-CM

## 2020-09-06 DIAGNOSIS — O0993 Supervision of high risk pregnancy, unspecified, third trimester: Secondary | ICD-10-CM

## 2020-09-06 NOTE — Progress Notes (Signed)
Pt having some contractions today. No vb. No lof.

## 2020-09-06 NOTE — Progress Notes (Signed)
Routine Prenatal Care Visit  Subjective  Tammy Frey is a 34 y.o. [phone] at [redacted]w[redacted]d being seen today for ongoing prenatal care.  She is currently monitored for the following issues for this high-risk pregnancy and has Supervision of high risk pregnancy, antepartum; Placenta previa antepartum in third trimester; Uterine contractions; [redacted] weeks gestation of pregnancy; Preterm uterine contractions in third trimester, antepartum; Preterm contractions; and Vasa previa on their problem list.  ----------------------------------------------------------------------------------- Patient reports having irregular contractions in the past week. She denies regular closely spaced contractions today.  She has an appointment with MFM in 1 week to discuss timing of delivery and c/section needs to be scheduled.  Contractions: Irregular. Vag. Bleeding: None.  Movement: Present. Leaking Fluid denies.  ----------------------------------------------------------------------------------- The following portions of the patient's history were reviewed and updated as appropriate: allergies, current medications, past family history, past medical history, past social history, past surgical history and problem list. Problem list updated.  Objective  Blood pressure 126/74, weight 173 lb (78.5 kg). Pregravid weight 148 lb (67.1 kg) Total Weight Gain 25 lb (11.3 kg) Urinalysis: Urine Protein    Urine Glucose    Fetal Status: Fetal Heart Rate (bpm): 134   Movement: Present     General:  Alert, oriented and cooperative. Patient is in no acute distress.  Skin: Skin is warm and dry. No rash noted.   Cardiovascular: Normal heart rate noted  Respiratory: Normal respiratory effort, no problems with respiration noted  Abdomen: Soft, gravid, appropriate for gestational age. Pain/Pressure: Absent     Pelvic:  Cervical exam deferred        Extremities: Normal range of motion.  Edema: None  Mental Status: Normal mood and affect.  Normal behavior. Normal judgment and thought content.   Assessment   34 y.o. [id number] at [redacted]w[redacted]d by  10/20/2020, by Ultrasound presenting for routine prenatal visit  Plan   Pregnancy#3 Problems (from 01/14/20 to present)    Problem Noted Resolved   Placenta previa antepartum in third trimester 08/23/2020 by Tresea Mall, CNM No   Supervision of high risk pregnancy, antepartum 03/09/2020 by Tresea Mall, CNM No   Overview Addendum 09/01/2020  5:21 AM by Natale Milch, MD     Nursing Staff Provider  Office Location  [facility] Dating   7 wk [place]  Language  English Anatomy US   complete, placenta previa with fetal vessel   Flu Vaccine  07/26/2020 Genetic Screen  NIPS:  Normal, gender surprise   TDaP vaccine  08/23/20  Hgb A1C or  GTT Third trimester : normal  Rhogam   not needed   LAB RESULTS   Feeding Plan formula Blood Type O/Positive/-- (07/01 1447)   Contraception BTL consent signed 11/18 Antibody Negative (07/01 1447)  Circumcision  Rubella 2.18 (07/01 1447)  Pediatrician   RPR Non Reactive (10/21 1537)   Support Person  partner John HBsAg Negative (07/01 1447)   Prenatal Classes  HIV Non Reactive (10/21 1537)    Varicella immune  BTL Consent Signed 11/18 GBS  (For PCN allergy, check sensitivities)        VBAC Consent  Pap  2021 NIL    Hgb Electro   abnormal    CF      SMA          High Risk pregnancy Diagnoses Vasa previa vs. Placenta previa- MFM referral Anemia in pregnancy, persistent hgb F- hematology referral placed        Previous Version       Preterm  labor symptoms and general obstetric precautions including but not limited to vaginal bleeding, contractions, leaking of fluid and fetal movement were reviewed in detail with the patient.   Return in about 8 days (around 09/14/2020) for rob.  Tresea Mall, CNM 09/06/2020 5:00 PM

## 2020-09-07 ENCOUNTER — Inpatient Hospital Stay: Payer: Medicaid Other | Attending: Oncology

## 2020-09-07 DIAGNOSIS — Z331 Pregnant state, incidental: Secondary | ICD-10-CM | POA: Insufficient documentation

## 2020-09-07 DIAGNOSIS — G40909 Epilepsy, unspecified, not intractable, without status epilepticus: Secondary | ICD-10-CM | POA: Insufficient documentation

## 2020-09-07 DIAGNOSIS — D649 Anemia, unspecified: Secondary | ICD-10-CM | POA: Diagnosis not present

## 2020-09-07 DIAGNOSIS — F17210 Nicotine dependence, cigarettes, uncomplicated: Secondary | ICD-10-CM | POA: Insufficient documentation

## 2020-09-07 DIAGNOSIS — Z79899 Other long term (current) drug therapy: Secondary | ICD-10-CM | POA: Insufficient documentation

## 2020-09-07 DIAGNOSIS — O99013 Anemia complicating pregnancy, third trimester: Secondary | ICD-10-CM

## 2020-09-07 DIAGNOSIS — D509 Iron deficiency anemia, unspecified: Secondary | ICD-10-CM | POA: Insufficient documentation

## 2020-09-07 LAB — CBC WITH DIFFERENTIAL/PLATELET
Abs Immature Granulocytes: 0.24 10*3/uL — ABNORMAL HIGH (ref 0.00–0.07)
Basophils Absolute: 0 10*3/uL (ref 0.0–0.1)
Basophils Relative: 0 %
Eosinophils Absolute: 0.1 10*3/uL (ref 0.0–0.5)
Eosinophils Relative: 1 %
HCT: 26.3 % — ABNORMAL LOW (ref 36.0–46.0)
Hemoglobin: 8.7 g/dL — ABNORMAL LOW (ref 12.0–15.0)
Immature Granulocytes: 2 %
Lymphocytes Relative: 24 %
Lymphs Abs: 2.4 10*3/uL (ref 0.7–4.0)
MCH: 31 pg (ref 26.0–34.0)
MCHC: 33.1 g/dL (ref 30.0–36.0)
MCV: 93.6 fL (ref 80.0–100.0)
Monocytes Absolute: 0.5 10*3/uL (ref 0.1–1.0)
Monocytes Relative: 5 %
Neutro Abs: 6.7 10*3/uL (ref 1.7–7.7)
Neutrophils Relative %: 68 %
Platelets: 235 10*3/uL (ref 150–400)
RBC: 2.81 MIL/uL — ABNORMAL LOW (ref 3.87–5.11)
RDW: 13.4 % (ref 11.5–15.5)
WBC: 10 10*3/uL (ref 4.0–10.5)
nRBC: 0 % (ref 0.0–0.2)

## 2020-09-07 LAB — IRON AND TIBC
Iron: 49 ug/dL (ref 28–170)
Saturation Ratios: 9 % — ABNORMAL LOW (ref 10.4–31.8)
TIBC: 525 ug/dL — ABNORMAL HIGH (ref 250–450)
UIBC: 476 ug/dL

## 2020-09-07 LAB — FERRITIN: Ferritin: 21 ng/mL (ref 11–307)

## 2020-09-09 ENCOUNTER — Other Ambulatory Visit: Payer: Self-pay | Admitting: Obstetrics and Gynecology

## 2020-09-09 DIAGNOSIS — O4403 Placenta previa specified as without hemorrhage, third trimester: Secondary | ICD-10-CM

## 2020-09-10 ENCOUNTER — Encounter: Payer: Self-pay | Admitting: Oncology

## 2020-09-10 ENCOUNTER — Other Ambulatory Visit: Payer: Self-pay | Admitting: Oncology

## 2020-09-10 DIAGNOSIS — D509 Iron deficiency anemia, unspecified: Secondary | ICD-10-CM | POA: Insufficient documentation

## 2020-09-10 DIAGNOSIS — D508 Other iron deficiency anemias: Secondary | ICD-10-CM

## 2020-09-10 HISTORY — DX: Iron deficiency anemia, unspecified: D50.9

## 2020-09-11 ENCOUNTER — Inpatient Hospital Stay (HOSPITAL_BASED_OUTPATIENT_CLINIC_OR_DEPARTMENT_OTHER): Payer: Medicaid Other | Admitting: Oncology

## 2020-09-11 ENCOUNTER — Inpatient Hospital Stay: Payer: Medicaid Other

## 2020-09-11 ENCOUNTER — Encounter: Payer: Self-pay | Admitting: Oncology

## 2020-09-11 VITALS — BP 113/68 | HR 81

## 2020-09-11 VITALS — BP 122/74 | HR 89 | Temp 98.3°F | Resp 18 | Wt 168.6 lb

## 2020-09-11 DIAGNOSIS — Z331 Pregnant state, incidental: Secondary | ICD-10-CM | POA: Diagnosis not present

## 2020-09-11 DIAGNOSIS — O99013 Anemia complicating pregnancy, third trimester: Secondary | ICD-10-CM | POA: Diagnosis not present

## 2020-09-11 DIAGNOSIS — D508 Other iron deficiency anemias: Secondary | ICD-10-CM | POA: Diagnosis not present

## 2020-09-11 DIAGNOSIS — D649 Anemia, unspecified: Secondary | ICD-10-CM | POA: Diagnosis not present

## 2020-09-11 DIAGNOSIS — Z79899 Other long term (current) drug therapy: Secondary | ICD-10-CM | POA: Diagnosis not present

## 2020-09-11 DIAGNOSIS — F17210 Nicotine dependence, cigarettes, uncomplicated: Secondary | ICD-10-CM | POA: Diagnosis not present

## 2020-09-11 DIAGNOSIS — G40909 Epilepsy, unspecified, not intractable, without status epilepticus: Secondary | ICD-10-CM | POA: Diagnosis not present

## 2020-09-11 DIAGNOSIS — D509 Iron deficiency anemia, unspecified: Secondary | ICD-10-CM | POA: Diagnosis not present

## 2020-09-11 MED ORDER — IRON SUCROSE 20 MG/ML IV SOLN
200.0000 mg | Freq: Once | INTRAVENOUS | Status: AC
  Administered 2020-09-11: 200 mg via INTRAVENOUS
  Filled 2020-09-11: qty 10

## 2020-09-11 MED ORDER — SODIUM CHLORIDE 0.9 % IV SOLN
Freq: Once | INTRAVENOUS | Status: AC
  Filled 2020-09-11: qty 250

## 2020-09-11 MED ORDER — SODIUM CHLORIDE 0.9 % IV SOLN: 200.0000 mg | Freq: Once | INTRAVENOUS | Status: DC

## 2020-09-11 NOTE — Progress Notes (Signed)
Pt tolerated Venofer Infusion well with no signs of complications. VSS. RN educate pt on the importance of notifying the clinic if any complications occur at home, pt verbalized understanding and all questions answered at this time. Pt stable for discharge. Pt discharged home.   [name] [employer]

## 2020-09-11 NOTE — Progress Notes (Signed)
Hematology/Oncology Consult note [facility] Telephone:([phone] [phone]   Patient Care Team: Natale Milch, MD as PCP - General (Obstetrics and Gynecology) Rickard Patience, MD as Consulting Physician (Hematology and Oncology)  REFERRING PROVIDER: Natale Milch, *  CHIEF COMPLAINTS/REASON FOR VISIT:  Evaluation of anemia during pregnancy  HISTORY OF PRESENTING ILLNESS:   Tammy Frey is a  34 y.o.  female with PMH listed below was seen in consultation at the request of  Jerene Pitch, [name], *  for evaluation of anemia during pregnancy Patient is currently in third trimester pregnancy. She reports feeling nauseated and fatigued. 07/26/2020, hemoglobin 8.9, MCV 91. Vitamin B12 580, iron saturation 16, TIBC 446, ferritin 46 Reviewed patient's previous labs. Anemia is new onset since June 2021 when she had a hemoglobin of 10.1.  Patient had a normal hemoglobin in 2020 She reports a family history of anemia.  Denies any personal history of hemoglobinopathy or any family history of hemoglobinopathy. 04/05/2020, patient's hemoglobinopathy evaluation showed increased hgb F, which may indicate the presence of a hereditary persistence gene,or due to elevated due to pregnancy, porphyria, some malignancies. Patient denies any rectal vaginal bleeding.  #INTERVAL HISTORY Tammy Frey is a 34 y.o. female who has above history reviewed by me today presents for follow up visit for management of iron deficiency anemia in pregnancy. Problems and complaints are listed below: Patient has tried oral iron supplementation since last visit.  She cannot tolerate well due to constipation and nausea. During interval patient was also admitted due to preterm contractions. Patient reports feeling tired and very thirsty.  She drinks a lot of fluid. Review of Systems  Constitutional: Positive for fatigue. Negative for appetite change, chills and fever.   HENT:   Negative for hearing loss and voice change.   Eyes: Negative for eye problems.  Respiratory: Negative for chest tightness and cough.   Cardiovascular: Negative for chest pain.  Gastrointestinal: Positive for nausea. Negative for abdominal distention, abdominal pain and blood in stool.  Endocrine: Negative for hot flashes.  Genitourinary: Negative for difficulty urinating and frequency.   Musculoskeletal: Negative for arthralgias.  Skin: Negative for itching and rash.  Neurological: Negative for extremity weakness.  Hematological: Negative for adenopathy.  Psychiatric/Behavioral: Negative for confusion.    MEDICAL HISTORY:  Past Medical History:  Diagnosis Date  . Anemia   . IDA (iron deficiency anemia) 09/10/2020  . Seizures (HCC)     SURGICAL HISTORY: No past surgical history on file.  SOCIAL HISTORY: Social History   Socioeconomic History  . Marital status: Single    Spouse name: Not on file  . Number of children: Not on file  . Years of education: Not on file  . Highest education level: Not on file  Occupational History  . Occupation: Film/video editor [employer]   Tobacco Use  . Smoking status: Current Every Day Smoker    Years: 20.00  . Smokeless tobacco: Never Used  . Tobacco comment: 3 cigarettes all day   Vaping Use  . Vaping Use: Former  . Devices: used vaping to quit smoking but did not work   Substance and Sexual Activity  . Alcohol use: Not Currently    Comment: occasionally- before preganancy   . Drug use: Yes    Types: Marijuana    Comment: last time she smoked was the week of 08/06/20  . Sexual activity: Yes    Birth control/protection: None  Other Topics Concern  . Not on file  Social History Narrative  . Not on file   Social Determinants of Health   Financial Resource Strain:   . Difficulty of Paying Living Expenses: Not on file  Food Insecurity:   . Worried About Programme researcher, broadcasting/film/videounning Out of Food in the Last Year: Not on file  . Ran Out of Food in  the Last Year: Not on file  Transportation Needs:   . Lack of Transportation (Medical): Not on file  . Lack of Transportation (Non-Medical): Not on file  Physical Activity:   . Days of Exercise per Week: Not on file  . Minutes of Exercise per Session: Not on file  Stress:   . Feeling of Stress : Not on file  Social Connections:   . Frequency of Communication with Friends and Family: Not on file  . Frequency of Social Gatherings with Friends and Family: Not on file  . Attends Religious Services: Not on file  . Active Member of Clubs or Organizations: Not on file  . Attends BankerClub or Organization Meetings: Not on file  . Marital Status: Not on file  Intimate Partner Violence:   . Fear of Current or Ex-Partner: Not on file  . Emotionally Abused: Not on file  . Physically Abused: Not on file  . Sexually Abused: Not on file    FAMILY HISTORY: Family History  Problem Relation Age of Onset  . Diabetes Father   . Breast cancer Paternal Aunt   . Lung cancer Paternal Uncle   . Cancer Paternal Grandfather     ALLERGIES:  is allergic to haldol [haloperidol].  MEDICATIONS:  Current Outpatient Medications  Medication Sig Dispense Refill  . docusate sodium (COLACE) 100 MG capsule Take 100 mg by mouth daily.    . famotidine (PEPCID) 20 MG tablet Take 20 mg by mouth 2 (two) times daily as needed for heartburn or indigestion.    . ferrous sulfate 325 (65 FE) MG EC tablet Take 325 mg by mouth daily with breakfast.    . Prenatal MV & Min w/FA-DHA (ONE A DAY PRENATAL PO) Take by mouth.     No current facility-administered medications for this visit.     PHYSICAL EXAMINATION: ECOG PERFORMANCE STATUS: 1 - Symptomatic but completely ambulatory Vitals:   09/11/20 1309  BP: 122/74  Pulse: 89  Resp: 18  Temp: 98.3 F (36.8 C)  SpO2: 100%   Filed Weights   09/11/20 1309  Weight: 168 lb 9 oz (76.5 kg)    Physical Exam Constitutional:      General: She is not in acute distress. HENT:      Head: Normocephalic and atraumatic.  Eyes:     General: No scleral icterus. Cardiovascular:     Rate and Rhythm: Normal rate and regular rhythm.     Heart sounds: Normal heart sounds.  Pulmonary:     Effort: Pulmonary effort is normal. No respiratory distress.     Breath sounds: No wheezing.  Abdominal:     General: Bowel sounds are normal. There is no distension.     Palpations: Abdomen is soft.     Comments: Gravid uterous  Musculoskeletal:     Cervical back: Normal range of motion and neck supple.     Right lower leg: No edema.     Left lower leg: No edema.  Skin:    General: Skin is warm and dry.     Findings: No erythema or rash.  Neurological:     Mental Status: She is alert and oriented to  person, place, and time. Mental status is at baseline.     Cranial Nerves: No cranial nerve deficit.     Coordination: Coordination normal.  Psychiatric:        Mood and Affect: Mood normal.     LABORATORY DATA:  I have reviewed the data as listed Lab Results  Component Value Date   WBC 10.0 09/07/2020   HGB 8.7 (L) 09/07/2020   HCT 26.3 (L) 09/07/2020   MCV 93.6 09/07/2020   PLT 235 09/07/2020   Recent Labs    03/06/20 0955 06/04/20 1115 08/16/20 1023  NA 134* 136 134*  K 3.4* 3.7 3.6  CL 104 107 104  CO2 23 22 22   GLUCOSE 101* 87 97  BUN 10 8 11   CREATININE 0.70 0.46 0.42*  CALCIUM 8.7* 8.3* 8.8*  GFRNONAA >60 >60 >60  GFRAA >60 >60  --   PROT  --  6.8 6.9  ALBUMIN  --  3.6 3.4*  AST  --  39 17  ALT  --  57* 17  ALKPHOS  --  36* 70  BILITOT  --  0.4 0.3   Iron/TIBC/Ferritin/ %Sat    Component Value Date/Time   IRON 49 09/07/2020 1022   IRON 73 08/10/2020 1219   TIBC 525 (H) 09/07/2020 1022   TIBC 446 08/10/2020 1219   FERRITIN 21 09/07/2020 1022   FERRITIN 46 08/10/2020 1219   IRONPCTSAT 9 (L) 09/07/2020 1022   IRONPCTSAT 16 08/10/2020 1219      RADIOGRAPHIC STUDIES: I have personally reviewed the radiological images as listed and agreed with  the findings in the report. 14/12/2019 MFM OB Transvaginal  Result Date: 08/16/2020 ----------------------------------------------------------------------  OBSTETRICS REPORT                       (Signed Final 08/16/2020 04:46 pm) ---------------------------------------------------------------------- Patient Info  ID #:       13/08/2020                          D.O.B.:  08/30/1986 (34 yrs)  Name:       Tammy Frey              Visit Date: 08/16/2020 03:17 pm ---------------------------------------------------------------------- Performed By  Attending:        Marcha Solders      Ref. Address:     [address]                    [place]                                                             [place], [place],                                                             [place]  Performed By:     [place]           Location:         [facility]                                                             [facility]  Referred By:      Natale Milch ---------------------------------------------------------------------- Orders  #  Description                           Code        Ordered By  1  [place] MFM OB DETAIL +14 WK               76811.01    CHRISTANNA                                                       SCHUMAN  2  [place] MFM OB TRANSVAGINAL                76817.2     [place] ----------------------------------------------------------------------  #  Order #                     Accession #                Episode #  1  [id number]                   [id number]                 [id number]  2  [id number]                   [id number]                 [id number] ---------------------------------------------------------------------- Indications  Placenta previa specified as without           O44.03  hemorrhage, third trimester  Vasa previa                                     O69.4XX0  [redacted] weeks gestation of pregnancy                Z3A.30 ---------------------------------------------------------------------- Fetal Evaluation  Num Of Fetuses:         1  Fetal Heart Rate(bpm):  137  Cardiac Activity:       Observed  Presentation:           Transverse, head to maternal left  Placenta:               Succenturiate lobe, previa  P. Cord Insertion:      Visualized, central  AFI Sum(cm)     %Tile  Largest Pocket(cm)  13.3            41          4.6  RUQ(cm)       RLQ(cm)       LUQ(cm)        LLQ(cm)  4.6           3.6           2.6            2.5 ---------------------------------------------------------------------- Biometry  BPD:        77  mm     G. Age:  30w 6d         44  %    CI:        76.95   %    70 - 86                                                          FL/HC:      20.1   %    19.3 - 21.3  HC:       278   mm     G. Age:  30w 3d         11  %    HC/AC:      1.00        0.96 - 1.17  AC:      276.8  mm     G. Age:  31w 5d         76  %    FL/BPD:     72.5   %    71 - 87  FL:       55.8  mm     G. Age:  29w 3d          8  %    FL/AC:      20.2   %    20 - 24  HUM:      50.1  mm     G. Age:  29w 3d         24  %  CER:        36  mm     G. Age:  30w 0d         13  %  LV:        1.6  mm  CM:        6.5  mm  Est. FW:    1644  gm    3 lb 10 oz      41  % ---------------------------------------------------------------------- Gestational Age  Clinical EDD:  30w 5d                                        EDD:   10/20/20  U/S Today:     30w 4d                                        EDD:   10/21/20  Best:          30w 5d     Det. By:  Arbie Cookey  Ultrasound         EDD:   10/20/20                                      (03/06/20) ---------------------------------------------------------------------- Anatomy  Cranium:               Appears normal         Aortic Arch:            Appears normal  Cavum:                 Appears normal         Ductal Arch:            Appears normal   Ventricles:            Appears normal         Diaphragm:              Appears normal  Choroid Plexus:        Appears normal         Stomach:                Appears normal, left                                                                        sided  Cerebellum:            Appears normal         Abdomen:                Appears normal  Posterior Fossa:       Appears normal         Abdominal Wall:         Appears nml (cord                                                                        insert, abd wall)  Nuchal Fold:           Appears normal         Cord Vessels:           Appears normal (3                                                                        vessel cord)  Face:                  Appears normal         Kidneys:                Appear normal                         (  orbits and profile)  Lips:                  Appears normal         Bladder:                Appears normal  Heart:                 Appears normal         Spine:                  Appears normal                         (4CH, axis, and                         situs)  RVOT:                  Appears normal         Upper Extremities:      Appears normal  LVOT:                  Appears normal         Lower Extremities:      Appears normal ---------------------------------------------------------------------- Impression  Ms. Osmon is a 34yo  G2P1 who is here in consultation at  the request of Dr. Jerene Pitch regarding possible placenta  previa vs vasa previa.  She is doing well today without complaints. She denies  vaginal bleeding or uterine contractions.  Her pregnancy is dated by a 7 week ultrasound with EDD of  10/20/20.  She had a low risk Mat 21.  Her medical history is uncomplicated. She denies significant  medical, surgical or family history. She occasionally smokes  marijuana to assist in supressing nausea and to improve her  appetite.  She had a prior exam on 07/27/20 with suspected placenta  previa vs vasa previa.  On today's  examination we observed normal anatomy with  measurements consistent with dates.  We observed good fetal movement and amniotic fluid volume.  A placenta previa was observed with 2 cm of placental edge  covering the internal with a fetal vessel covering placental  tissue. The placenta appears bilobed but in further evaluation  the placenta is maternal right lateral with a thinning placenta  edge cover the cervix. We interrogated the cervix with  transvaginally and reviwed the images live with Ms.  Alferd Patee.  I discussed the following regarding a placenta previa We  discussed the fact that placenta previa is associated with an  increased risk of bleeding, inpatient stay, emergency  cesarean delivery, preterm birth, and bleeding precautions  were reviewed. We advised pelvic rest. Should this condition  fail to resolve, we discussed the necessity of cesarean  delivery. Today's sonogram demonstrates no evidence of  invasive placentation. A follow up exam is recommended in 4  weeks.  Finally I recommend delivery between 35-37 weeks if today's  finding persist.  All questions were answered  I spent 30 minute with > 50% in face to face consultation.  I reviewed today's images and recommendation directly with  Dr. Jerene Pitch. ---------------------------------------------------------------------- Recommendations  Follow up growth and placenta views in 4 weeks. ----------------------------------------------------------------------               Lin Landsman, MD Electronically Signed Final Report   08/16/2020 04:46 pm ----------------------------------------------------------------------  [place] MFM OB DETAIL +14 WK  Result Date: 08/16/2020 ----------------------------------------------------------------------  OBSTETRICS REPORT                       (Signed Final 08/16/2020 04:46 pm) ---------------------------------------------------------------------- Patient Info  ID #:       [id number]                          D.O.B.:   Feb 15, 1986 (34 yrs)  Name:       Tammy Frey              Visit Date: 08/16/2020 03:17 pm ---------------------------------------------------------------------- Performed By  Attending:        Lin Landsman      Ref. Address:     [address]                    [place]                                                             [place], [place],                                                             [place] [address]  Performed By:     Francene Boyers           [facility]                                                             [facility]  Referred By:      Natale Milch ---------------------------------------------------------------------- Orders  #  Description                           Code        Ordered By  1  [place] MFM OB DETAIL +14 WK               19147.82    CHRISTANNA                                                       SCHUMAN  2  [place] MFM OB TRANSVAGINAL  57846.9     [facility] ----------------------------------------------------------------------  #  Order #                     Accession #                Episode #  1  [id number]                   [id number]                 [id number]  2  [id number]                   [id number]                 [id number] ---------------------------------------------------------------------- Indications  Placenta previa specified as without           O44.03  hemorrhage, third trimester  Vasa previa                                    O69.4XX0  [redacted] weeks gestation of pregnancy                Z3A.30 ---------------------------------------------------------------------- Fetal Evaluation  Num Of Fetuses:         1  Fetal Heart Rate(bpm):  137  Cardiac Activity:       Observed  Presentation:           Transverse, head to maternal left  Placenta:               Succenturiate lobe,  previa  P. Cord Insertion:      Visualized, central  AFI Sum(cm)     %Tile       Largest Pocket(cm)  13.3            41          4.6  RUQ(cm)       RLQ(cm)       LUQ(cm)        LLQ(cm)  4.6           3.6           2.6            2.5 ---------------------------------------------------------------------- Biometry  BPD:        77  mm     G. Age:  30w 6d         44  %    CI:        76.95   %    70 - 86                                                          FL/HC:      20.1   %    19.3 - 21.3  HC:       278   mm     G. Age:  38w 3d  11  %    HC/AC:      1.00        0.96 - 1.17  AC:      276.8  mm     G. Age:  31w 5d         76  %    FL/BPD:     72.5   %    71 - 87  FL:       55.8  mm     G. Age:  29w 3d          8  %    FL/AC:      20.2   %    20 - 24  HUM:      50.1  mm     G. Age:  29w 3d         24  %  CER:        36  mm     G. Age:  30w 0d         13  %  LV:        1.6  mm  CM:        6.5  mm  Est. FW:    1644  gm    3 lb 10 oz      41  % ---------------------------------------------------------------------- Gestational Age  Clinical EDD:  30w 5d                                        EDD:   10/20/20  U/S Today:     30w 4d                                        EDD:   10/21/20  Best:          30w 5d     Det. By:  Marcella Dubs         EDD:   10/20/20                                      (03/06/20) ---------------------------------------------------------------------- Anatomy  Cranium:               Appears normal         Aortic Arch:            Appears normal  Cavum:                 Appears normal         Ductal Arch:            Appears normal  Ventricles:            Appears normal         Diaphragm:              Appears normal  Choroid Plexus:        Appears normal         Stomach:                Appears normal, left  sided  Cerebellum:            Appears normal         Abdomen:                Appears normal  Posterior Fossa:        Appears normal         Abdominal Wall:         Appears nml (cord                                                                        insert, abd wall)  Nuchal Fold:           Appears normal         Cord Vessels:           Appears normal (3                                                                        vessel cord)  Face:                  Appears normal         Kidneys:                Appear normal                         (orbits and profile)  Lips:                  Appears normal         Bladder:                Appears normal  Heart:                 Appears normal         Spine:                  Appears normal                         (4CH, axis, and                         situs)  RVOT:                  Appears normal         Upper Extremities:      Appears normal  LVOT:                  Appears normal         Lower Extremities:      Appears normal ---------------------------------------------------------------------- Impression  Ms. Neiss is a 34yo  G2P1 who is here in consultation at  the request of Dr. Jerene Pitch regarding possible placenta  previa vs vasa previa.  She is doing well today without complaints. She denies  vaginal bleeding  or uterine contractions.  Her pregnancy is dated by a 7 week ultrasound with EDD of  10/20/20.  She had a low risk Mat 21.  Her medical history is uncomplicated. She denies significant  medical, surgical or family history. She occasionally smokes  marijuana to assist in supressing nausea and to improve her  appetite.  She had a prior exam on 07/27/20 with suspected placenta  previa vs vasa previa.  On today's examination we observed normal anatomy with  measurements consistent with dates.  We observed good fetal movement and amniotic fluid volume.  A placenta previa was observed with 2 cm of placental edge  covering the internal with a fetal vessel covering placental  tissue. The placenta appears bilobed but in further evaluation  the placenta is maternal right lateral  with a thinning placenta  edge cover the cervix. We interrogated the cervix with  transvaginally and reviwed the images live with Ms.  Alferd Patee.  I discussed the following regarding a placenta previa We  discussed the fact that placenta previa is associated with an  increased risk of bleeding, inpatient stay, emergency  cesarean delivery, preterm birth, and bleeding precautions  were reviewed. We advised pelvic rest. Should this condition  fail to resolve, we discussed the necessity of cesarean  delivery. Today's sonogram demonstrates no evidence of  invasive placentation. A follow up exam is recommended in 4  weeks.  Finally I recommend delivery between 35-37 weeks if today's  finding persist.  All questions were answered  I spent 30 minute with > 50% in face to face consultation.  I reviewed today's images and recommendation directly with  Dr. Jerene Pitch. ---------------------------------------------------------------------- Recommendations  Follow up growth and placenta views in 4 weeks. ----------------------------------------------------------------------               Lin Landsman, MD Electronically Signed Final Report   08/16/2020 04:46 pm ----------------------------------------------------------------------     ASSESSMENT & PLAN:  1. Other iron deficiency anemia   2. Anemia during pregnancy in third trimester    #Labs were reviewed and discussed with patient. Anemia in pregnancy Iron panel was reviewed.  Despite taking oral iron supplementation patient has developed further decrease of iron saturation of 9, increase of TIBC 525, ferritin 21. I discussed with her about option of IV Venofer treatments. Plan IV iron with Venofer 200mg  weekly x 3 doses. Allergy reactions/infusion reaction including anaphylactic reaction discussed with patient. Other side effects include but not limited to high blood pressure, skin rash, weight gain, leg swelling, etc. Venofer is a category B medication.  Patient  voices understanding and willing to proceed.  She will follow-up with me in 6 to 8 weeks for evaluation of need of additional IV Venofer treatments.   Orders Placed This Encounter  Procedures  . CBC with Differential/Platelet    Standing Status:   Future    Standing Expiration Date:   09/11/2021  . Ferritin    Standing Status:   Future    Standing Expiration Date:   09/11/2021  . Iron and TIBC    Standing Status:   Future    Standing Expiration Date:   09/11/2021    All questions were answered. The patient knows to call the clinic with any problems questions or concerns.  cc Schuman, [name], *     Thank you for this kind referral and the opportunity to participate in the care of this patient. A copy of today's note is routed to referring provider    Rickard Patience,  MD, PhD Hematology Oncology [facility] at [facility] Pager- [phone] 09/11/2020

## 2020-09-13 ENCOUNTER — Ambulatory Visit: Payer: Medicaid Other | Attending: Obstetrics and Gynecology

## 2020-09-13 ENCOUNTER — Other Ambulatory Visit: Payer: Self-pay

## 2020-09-13 ENCOUNTER — Other Ambulatory Visit: Payer: Self-pay | Admitting: Obstetrics and Gynecology

## 2020-09-13 DIAGNOSIS — Z3A34 34 weeks gestation of pregnancy: Secondary | ICD-10-CM

## 2020-09-13 DIAGNOSIS — O4403 Placenta previa specified as without hemorrhage, third trimester: Secondary | ICD-10-CM | POA: Diagnosis not present

## 2020-09-13 DIAGNOSIS — D509 Iron deficiency anemia, unspecified: Secondary | ICD-10-CM

## 2020-09-13 DIAGNOSIS — O99113 Other diseases of the blood and blood-forming organs and certain disorders involving the immune mechanism complicating pregnancy, third trimester: Secondary | ICD-10-CM | POA: Diagnosis not present

## 2020-09-13 DIAGNOSIS — O0990 Supervision of high risk pregnancy, unspecified, unspecified trimester: Secondary | ICD-10-CM

## 2020-09-13 NOTE — Progress Notes (Unsigned)
Pt c/o Braxton Hicks contractions since 09/12/20 @ 2030.  Pt was able to sleep through the night.  Thinks she might be leaking some fluid or urine-not sure.  VSS.  No vaginal bleeding noted.  Pain 4/10 with contractions. F/U US now with MFM @ [facility].  Dr. Judeth Cornfield notified of pt status.

## 2020-09-14 ENCOUNTER — Ambulatory Visit (INDEPENDENT_AMBULATORY_CARE_PROVIDER_SITE_OTHER): Payer: Medicaid Other | Admitting: Obstetrics and Gynecology

## 2020-09-14 ENCOUNTER — Encounter: Payer: Medicaid Other | Admitting: Obstetrics and Gynecology

## 2020-09-14 ENCOUNTER — Encounter: Payer: Self-pay | Admitting: Obstetrics and Gynecology

## 2020-09-14 VITALS — BP 136/88 | Wt 175.0 lb

## 2020-09-14 DIAGNOSIS — O4403 Placenta previa specified as without hemorrhage, third trimester: Secondary | ICD-10-CM

## 2020-09-14 DIAGNOSIS — O0993 Supervision of high risk pregnancy, unspecified, third trimester: Secondary | ICD-10-CM

## 2020-09-14 DIAGNOSIS — Z3A34 34 weeks gestation of pregnancy: Secondary | ICD-10-CM

## 2020-09-14 DIAGNOSIS — D508 Other iron deficiency anemias: Secondary | ICD-10-CM

## 2020-09-14 NOTE — Progress Notes (Signed)
Routine Prenatal Care Visit  Subjective  Tammy Frey is a 34 y.o. [phone] at [redacted]w[redacted]d being seen today for ongoing prenatal care.  She is currently monitored for the following issues for this high-risk pregnancy and has Supervision of high risk pregnancy, antepartum; Placenta previa antepartum in third trimester; Uterine contractions; [redacted] weeks gestation of pregnancy; Preterm uterine contractions in third trimester, antepartum; Preterm contractions; Vasa previa; and IDA (iron deficiency anemia) on their problem list.  ----------------------------------------------------------------------------------- Patient reports ctx q 15 minutes, worse at night.   Contractions: Irregular. Vag. Bleeding: None.  Movement: Present. Leaking Fluid denies.  - U/S with MFM yesterday shows cephalic presentation. No placenta or vasa previa.  May deliver vaginally ----------------------------------------------------------------------------------- The following portions of the patient's history were reviewed and updated as appropriate: allergies, current medications, past family history, past medical history, past social history, past surgical history and problem list. Problem list updated.  Objective  Blood pressure 136/88, weight 175 lb (79.4 kg). Pregravid weight 148 lb (67.1 kg) Total Weight Gain 27 lb (12.2 kg) Urinalysis: Urine Protein    Urine Glucose    Fetal Status: Fetal Heart Rate (bpm): 140 Fundal Height: 35 cm Movement: Present  Presentation: Vertex  General:  Alert, oriented and cooperative. Patient is in no acute distress.  Skin: Skin is warm and dry. No rash noted.   Cardiovascular: Normal heart rate noted  Respiratory: Normal respiratory effort, no problems with respiration noted  Abdomen: Soft, gravid, appropriate for gestational age. Pain/Pressure: Present     Pelvic:  Cervical exam deferred        Extremities: Normal range of motion.  Edema: None  Mental Status: Normal mood and affect.  Normal behavior. Normal judgment and thought content.   Assessment   34 y.o. [id number] at [redacted]w[redacted]d by  10/20/2020, by Ultrasound presenting for routine prenatal visit  Plan   Pregnancy#3 Problems (from 01/14/20 to present)    Problem Noted Resolved   Placenta previa antepartum in third trimester 08/23/2020 by Tresea Mall, CNM No   Overview Signed 09/14/2020 11:59 AM by Conard Novak, MD    - resolve. See MFM note  Placenta is anterior and on transabdominal  ultrasound, no placenta previa is seen.  A thin membranous portion (chorion) of the placenta is seen 1.4 cm from the internal os. Color- Doppler did not show any fetal or maternal vessels near the internal os. The thick placental edge is 2.4 cm from the internal os. To facilitate proper examination, the fetal head was displaced upward (by the MD).  The cervix looks long and closed.  Counseling as follows:  -Although a small membranous portion of the placenta is closer to the internal os, it is unlikely to cause hemorrhage during labor. Descent of fetal head is likely to compress the placental edge and she should be able to deliver without any complications.  -The thicker placental (vascular) edge is not low lying. No evidence of vasa previa.  -Vaginal delivery is highly likely.  -Patient can await spontaneous onset of labor. Alternatively, induction of labor may be considered after 39 weeks if cervix is favorable and patient desires planned delivery.  -Spontaneous labor is accompanied by some amount of vaginal bleeding.  Cesarean section would be considered if    -Patient has significant vaginal bleeding at or after 36 weeks'  gestation. Inpatient management would be recommended if patient has vaginal bleeding before 36 weeks' gestation.   -Patient has significant bleeding during labor that cannot be accounted for normal amount of  bleeding that accompanies  labor. ---------------------------------------------------------------------- Recommendations  -Follow-up scans as clinically indicated.  -Vaginal delivery can be attempted and cesarean section for  reasons mentioned above.  -Blood to be available at short noticd during delivery (iron  deficiency anemia).      Supervision of high risk pregnancy, antepartum 03/09/2020 by Tresea Mall, CNM No   Overview Addendum 09/01/2020  5:21 AM by Natale Milch, MD     Nursing Staff Provider  Office Location  [facility] Dating   7 wk [place]  Language  English Anatomy US   complete, placenta previa with fetal vessel   Flu Vaccine  07/26/2020 Genetic Screen  NIPS:  Normal, gender surprise   TDaP vaccine  08/23/20  Hgb A1C or  GTT Third trimester : normal  Rhogam   not needed   LAB RESULTS   Feeding Plan formula Blood Type O/Positive/-- (07/01 1447)   Contraception BTL consent signed 11/18 Antibody Negative (07/01 1447)  Circumcision  Rubella 2.18 (07/01 1447)  Pediatrician   RPR Non Reactive (10/21 1537)   Support Person  partner John HBsAg Negative (07/01 1447)   Prenatal Classes  HIV Non Reactive (10/21 1537)    Varicella immune  BTL Consent Signed 11/18 GBS  (For PCN allergy, check sensitivities)        VBAC Consent  Pap  2021 NIL    Hgb Electro   abnormal    CF      SMA          High Risk pregnancy Diagnoses Vasa previa vs. Placenta previa- MFM referral Anemia in pregnancy, persistent hgb F- hematology referral placed        Previous Version       Preterm labor symptoms and general obstetric precautions including but not limited to vaginal bleeding, contractions, leaking of fluid and fetal movement were reviewed in detail with the patient. Please refer to After Visit Summary for other counseling recommendations.   - will watch closely as she appears to contract somewhat regularly. Discussed that she may deliver vaginally and c-section would be for the usual indications.   Growth was 21st %ile. Normal afi.   - continue iron infusions  Return in about 1 week (around 09/21/2020) for Routine Prenatal Appointment.   Thomasene Mohair, MD, [facility], [facility] 09/14/2020 12:13 PM

## 2020-09-16 ENCOUNTER — Observation Stay
Admission: EM | Admit: 2020-09-16 | Discharge: 2020-09-16 | Disposition: A | Discharge status: Home / Self Care | Payer: Medicaid Other | Attending: Obstetrics and Gynecology | Admitting: Obstetrics and Gynecology

## 2020-09-16 ENCOUNTER — Encounter: Payer: Self-pay | Admitting: Obstetrics and Gynecology

## 2020-09-16 DIAGNOSIS — O4193X1 Disorder of amniotic fluid and membranes, unspecified, third trimester, fetus 1: Secondary | ICD-10-CM | POA: Diagnosis not present

## 2020-09-16 DIAGNOSIS — Z3A35 35 weeks gestation of pregnancy: Secondary | ICD-10-CM | POA: Diagnosis not present

## 2020-09-16 DIAGNOSIS — O418X30 Other specified disorders of amniotic fluid and membranes, third trimester, not applicable or unspecified: Secondary | ICD-10-CM | POA: Diagnosis not present

## 2020-09-16 DIAGNOSIS — O4290 Premature rupture of membranes, unspecified as to length of time between rupture and onset of labor, unspecified weeks of gestation: Secondary | ICD-10-CM | POA: Diagnosis present

## 2020-09-16 LAB — WET PREP, GENITAL
Clue Cells Wet Prep HPF POC: NONE SEEN
Sperm: NONE SEEN
Trich, Wet Prep: NONE SEEN
Yeast Wet Prep HPF POC: NONE SEEN

## 2020-09-16 LAB — CHLAMYDIA/NGC RT PCR (ARMC ONLY)
Chlamydia Tr: NOT DETECTED
N gonorrhoeae: NOT DETECTED

## 2020-09-16 LAB — RUPTURE OF MEMBRANE (ROM)PLUS: Rom Plus: NEGATIVE

## 2020-09-16 NOTE — OB Triage Note (Signed)
Notified Dr. Jerene Pitch of patient's lab results and negative Rom+. New orders given to discharge the patient to home as she says she is ready to leave and her ride is downstairs.

## 2020-09-16 NOTE — OB Triage Note (Signed)
Dr. Jerene Pitch notified that patient has arrived to unit and SBAR given. Dr. Jerene Pitch gave orders for ROM + test.

## 2020-09-16 NOTE — Discharge Summary (Signed)
Physician Discharge Summary  Patient ID: ARTHEA NOBEL MRN: [id number] DOB/AGE: [birth year] 34 y.o.  Admit date: 09/16/2020 Discharge date: 09/16/2020  Admission Diagnoses: Leakage of amniotic fluid  Discharge Diagnoses:  Active Problems:   PROM (premature rupture of membranes)   Leakage of amniotic fluid  No leakage of amniotic fluid detected.   Discharged Condition: good  Hospital Course: patient was admitted for observation to the triage area for possible leakage of fluid. ROM plus was negative and no signs of vaginal infection. Reactive fetal tracing. She was discahrged home in stable condition.   Consults: None  Significant Diagnostic Studies: labs: see epic  Treatments: reassurance  Discharge Exam: Blood pressure 125/68, pulse 84, temperature 98.2 F (36.8 C), temperature source Oral, resp. rate 16. General appearance: alert and cooperative Chest wall: no tenderness Cardio: regular rate and rhythm, S1, S2 normal, no murmur, click, rub or gallop GI: soft, non-tender; bowel sounds normal; no masses,  no organomegaly Extremities: extremities normal, atraumatic, no cyanosis or edema Skin: Skin color, texture, turgor normal. No rashes or lesions  Disposition: Discharge disposition: 01-Home or Self Care        Allergies as of 09/16/2020      Reactions   Haldol [haloperidol]    Dystonic reaction      Medication List    TAKE these medications   docusate sodium 100 MG capsule Commonly known as: COLACE Take 100 mg by mouth daily.   famotidine 20 MG tablet Commonly known as: PEPCID Take 20 mg by mouth 2 (two) times daily as needed for heartburn or indigestion.   ferrous sulfate 325 (65 FE) MG EC tablet Take 325 mg by mouth daily with breakfast.   ONE A DAY PRENATAL PO Take by mouth.        Signed: Natale Milch 09/16/2020, 9:57 AM

## 2020-09-16 NOTE — Progress Notes (Signed)
Patient arrived to unit with complaints of ruptured membranes. Patient states she has been having wet panties since 09/12/2020 and did make MD  Aware on last visit. Patient reports since then she has been having more episodes of wet panties and is concerned that she is ruptured. Patient is G4P1 [redacted]w[redacted]d. Patient placed on EFM and toco to non tender area of abdomen. History reviewed. Will notify provider on call of patients arrival.

## 2020-09-16 NOTE — Progress Notes (Signed)
   09/16/20 0447  AVS Discharge Documentation  AVS Discharge Instructions Including Medications Provided to patient/caregiver  Name of Person Receiving AVS Discharge Instructions Including Medications Tammy Frey  Name of Clinician That Reviewed AVS Discharge Instructions Including Medications D. Harlan Ervine, RN    Patient discharged to home. Patient encouraged to keep all remaining outpatient visits. L&D triage care complete. Pt left unit ambulatory to be driven home by her friend in private passenger vehicle.

## 2020-09-21 ENCOUNTER — Ambulatory Visit (INDEPENDENT_AMBULATORY_CARE_PROVIDER_SITE_OTHER): Payer: Medicaid Other | Admitting: Advanced Practice Midwife

## 2020-09-21 ENCOUNTER — Encounter: Payer: Self-pay | Admitting: Advanced Practice Midwife

## 2020-09-21 ENCOUNTER — Other Ambulatory Visit: Payer: Self-pay

## 2020-09-21 ENCOUNTER — Other Ambulatory Visit (HOSPITAL_COMMUNITY)
Admission: RE | Admit: 2020-09-21 | Discharge: 2020-09-21 | Disposition: A | Discharge status: Home / Self Care | Payer: Medicaid Other | Source: Ambulatory Visit | Attending: Advanced Practice Midwife | Admitting: Advanced Practice Midwife

## 2020-09-21 VITALS — BP 120/70

## 2020-09-21 DIAGNOSIS — Z113 Encounter for screening for infections with a predominantly sexual mode of transmission: Secondary | ICD-10-CM | POA: Insufficient documentation

## 2020-09-21 DIAGNOSIS — Z3A35 35 weeks gestation of pregnancy: Secondary | ICD-10-CM

## 2020-09-21 DIAGNOSIS — Z3483 Encounter for supervision of other normal pregnancy, third trimester: Secondary | ICD-10-CM

## 2020-09-21 DIAGNOSIS — Z3685 Encounter for antenatal screening for Streptococcus B: Secondary | ICD-10-CM | POA: Diagnosis not present

## 2020-09-21 LAB — POCT URINALYSIS DIPSTICK OB
Glucose, UA: NEGATIVE
POC,PROTEIN,UA: NEGATIVE

## 2020-09-21 NOTE — Progress Notes (Signed)
Routine Prenatal Care Visit  Subjective  Tammy Frey is a 34 y.o. [phone] at [redacted]w[redacted]d being seen today for ongoing prenatal care.  She is currently monitored for the following issues for this low-risk pregnancy and has Supervision of other normal pregnancy, antepartum; Uterine contractions; [redacted] weeks gestation of pregnancy; Preterm uterine contractions in third trimester, antepartum; Preterm contractions; IDA (iron deficiency anemia); PROM (premature rupture of membranes); and Leakage of amniotic fluid on their problem list.  ----------------------------------------------------------------------------------- Patient reports irregular painful contractions.   Contractions: Irregular. Vag. Bleeding: None.  Movement: Present. Leaking Fluid denies.  ----------------------------------------------------------------------------------- The following portions of the patient's history were reviewed and updated as appropriate: allergies, current medications, past family history, past medical history, past social history, past surgical history and problem list. Problem list updated.  Objective  Blood pressure 120/70. Pregravid weight 148 lb (67.1 kg) Total Weight Gain 27 lb (12.2 kg) Urinalysis: Urine Protein    Urine Glucose    Fetal Status: Fetal Heart Rate (bpm): 143 Fundal Height: 36 cm Movement: Present     General:  Alert, oriented and cooperative. Patient is in no acute distress.  Skin: Skin is warm and dry. No rash noted.   Cardiovascular: Normal heart rate noted  Respiratory: Normal respiratory effort, no problems with respiration noted  Abdomen: Soft, gravid, appropriate for gestational age. Pain/Pressure: Present     Pelvic:  GBS/aptima collected, attempted cervical exam and unable to reach posterior cervix        Extremities: Normal range of motion.  Edema: None  Mental Status: Normal mood and affect. Normal behavior. Normal judgment and thought content.   Assessment   34 y.o.  [id number] at [redacted]w[redacted]d by  10/20/2020, by Ultrasound presenting for routine prenatal visit  Plan   Pregnancy#3 Problems (from 01/14/20 to present)    Problem Noted Resolved   Supervision of other normal pregnancy, antepartum 03/09/2020 by Tresea Mall, CNM No   Overview Addendum 09/01/2020  5:21 AM by Natale Milch, MD     Nursing Staff Provider  Office Location  [facility] Dating   7 wk [place]  Language  English Anatomy US   complete, placenta previa with fetal vessel   Flu Vaccine  07/26/2020 Genetic Screen  NIPS:  Normal, gender surprise   TDaP vaccine  08/23/20  Hgb A1C or  GTT Third trimester : normal  Rhogam   not needed   LAB RESULTS   Feeding Plan formula Blood Type O/Positive/-- (07/01 1447)   Contraception BTL consent signed 11/18 Antibody Negative (07/01 1447)  Circumcision  Rubella 2.18 (07/01 1447)  Pediatrician   RPR Non Reactive (10/21 1537)   Support Person  partner John HBsAg Negative (07/01 1447)   Prenatal Classes  HIV Non Reactive (10/21 1537)    Varicella immune  BTL Consent Signed 11/18 GBS  (For PCN allergy, check sensitivities)        VBAC Consent  Pap  2021 NIL    Hgb Electro   abnormal    CF      SMA          High Risk pregnancy Diagnoses Vasa previa vs. Placenta previa- MFM referral Anemia in pregnancy, persistent hgb F- hematology referral placed        Previous Version   Placenta previa antepartum in third trimester 08/23/2020 by Tresea Mall, CNM 09/21/2020 by Tresea Mall, CNM   Overview Signed 09/14/2020 11:59 AM by Conard Novak, MD    - resolve. See MFM note  Placenta is anterior  and on transabdominal  ultrasound, no placenta previa is seen.  A thin membranous portion (chorion) of the placenta is seen 1.4 cm from the internal os. Color- Doppler did not show any fetal or maternal vessels near the internal os. The thick placental edge is 2.4 cm from the internal os. To facilitate proper examination, the fetal head was displaced upward  (by the MD).  The cervix looks long and closed.  Counseling as follows:  -Although a small membranous portion of the placenta is closer to the internal os, it is unlikely to cause hemorrhage during labor. Descent of fetal head is likely to compress the placental edge and she should be able to deliver without any complications.  -The thicker placental (vascular) edge is not low lying. No evidence of vasa previa.  -Vaginal delivery is highly likely.  -Patient can await spontaneous onset of labor. Alternatively, induction of labor may be considered after 39 weeks if cervix is favorable and patient desires planned delivery.  -Spontaneous labor is accompanied by some amount of vaginal bleeding.  Cesarean section would be considered if    -Patient has significant vaginal bleeding at or after 36 weeks'  gestation. Inpatient management would be recommended if patient has vaginal bleeding before 36 weeks' gestation.   -Patient has significant bleeding during labor that cannot be accounted for normal amount of bleeding that accompanies labor. ---------------------------------------------------------------------- Recommendations  -Follow-up scans as clinically indicated.  -Vaginal delivery can be attempted and cesarean section for  reasons mentioned above.  -Blood to be available at short noticd during delivery (iron  deficiency anemia).          Preterm labor symptoms and general obstetric precautions including but not limited to vaginal bleeding, contractions, leaking of fluid and fetal movement were reviewed in detail with the patient.   Return in about 1 week (around 09/28/2020) for rob.  Tresea Mall, CNM 09/21/2020 3:23 PM

## 2020-09-21 NOTE — Addendum Note (Signed)
Addended by: Cornelius Moras D on: 09/21/2020 03:39 PM   Modules accepted: Orders

## 2020-09-23 LAB — STREP GP B NAA: Strep Gp B NAA: NEGATIVE

## 2020-09-25 ENCOUNTER — Inpatient Hospital Stay: Payer: Medicaid Other

## 2020-09-25 DIAGNOSIS — D508 Other iron deficiency anemias: Secondary | ICD-10-CM

## 2020-09-25 DIAGNOSIS — F17210 Nicotine dependence, cigarettes, uncomplicated: Secondary | ICD-10-CM | POA: Diagnosis not present

## 2020-09-25 DIAGNOSIS — D649 Anemia, unspecified: Secondary | ICD-10-CM | POA: Diagnosis not present

## 2020-09-25 DIAGNOSIS — G40909 Epilepsy, unspecified, not intractable, without status epilepticus: Secondary | ICD-10-CM | POA: Diagnosis not present

## 2020-09-25 DIAGNOSIS — Z331 Pregnant state, incidental: Secondary | ICD-10-CM | POA: Diagnosis not present

## 2020-09-25 DIAGNOSIS — D509 Iron deficiency anemia, unspecified: Secondary | ICD-10-CM | POA: Diagnosis not present

## 2020-09-25 DIAGNOSIS — Z79899 Other long term (current) drug therapy: Secondary | ICD-10-CM | POA: Diagnosis not present

## 2020-09-25 LAB — CERVICOVAGINAL ANCILLARY ONLY
Chlamydia: NEGATIVE
Comment: NEGATIVE
Comment: NEGATIVE
Comment: NORMAL
Neisseria Gonorrhea: NEGATIVE
Trichomonas: NEGATIVE

## 2020-09-25 MED ORDER — SODIUM CHLORIDE 0.9 % IV SOLN: 200.0000 mg | Freq: Once | INTRAVENOUS | Status: DC

## 2020-09-25 MED ORDER — SODIUM CHLORIDE 0.9 % IV SOLN
Freq: Once | INTRAVENOUS | Status: AC
  Filled 2020-09-25: qty 250

## 2020-09-25 MED ORDER — IRON SUCROSE 20 MG/ML IV SOLN
200.0000 mg | Freq: Once | INTRAVENOUS | Status: AC
  Administered 2020-09-25: 200 mg via INTRAVENOUS
  Filled 2020-09-25: qty 10

## 2020-09-25 NOTE — Progress Notes (Signed)
Stable at discharge 

## 2020-10-01 ENCOUNTER — Other Ambulatory Visit: Payer: Self-pay

## 2020-10-01 ENCOUNTER — Encounter: Payer: Self-pay | Admitting: Obstetrics and Gynecology

## 2020-10-01 ENCOUNTER — Ambulatory Visit (INDEPENDENT_AMBULATORY_CARE_PROVIDER_SITE_OTHER): Payer: Medicaid Other | Admitting: Obstetrics and Gynecology

## 2020-10-01 VITALS — BP 118/60 | Wt 174.0 lb

## 2020-10-01 DIAGNOSIS — Z3A37 37 weeks gestation of pregnancy: Secondary | ICD-10-CM

## 2020-10-01 DIAGNOSIS — O0993 Supervision of high risk pregnancy, unspecified, third trimester: Secondary | ICD-10-CM

## 2020-10-01 NOTE — Progress Notes (Signed)
Routine Prenatal Care Visit  Subjective  Tammy Frey is a 34 y.o. [phone] at [redacted]w[redacted]d being seen today for ongoing prenatal care.  She is currently monitored for the following issues for this high-risk pregnancy and has Supervision of other high risk pregnancies, third trimester; Uterine contractions; [redacted] weeks gestation of pregnancy; Preterm uterine contractions in third trimester, antepartum; Preterm contractions; IDA (iron deficiency anemia); PROM (premature rupture of membranes); and Leakage of amniotic fluid on their problem list.  ----------------------------------------------------------------------------------- Patient reports pelvic pressure and irregular contractions.   Contractions: Irregular. Vag. Bleeding: None.  Movement: Present. Denies leaking of fluid.  ----------------------------------------------------------------------------------- The following portions of the patient's history were reviewed and updated as appropriate: allergies, current medications, past family history, past medical history, past social history, past surgical history and problem list. Problem list updated.   Objective  Blood pressure 118/60, weight 174 lb (78.9 kg). Pregravid weight 148 lb (67.1 kg) Total Weight Gain 26 lb (11.8 kg) Urinalysis:      Fetal Status: Fetal Heart Rate (bpm): 154 Fundal Height: 37 cm Movement: Present  Presentation: Vertex  General:  Alert, oriented and cooperative. Patient is in no acute distress.  Skin: Skin is warm and dry. No rash noted.   Cardiovascular: Normal heart rate noted  Respiratory: Normal respiratory effort, no problems with respiration noted  Abdomen: Soft, gravid, appropriate for gestational age. Pain/Pressure: Present     Pelvic:  Cervical exam deferred        Extremities: Normal range of motion.  Edema: None  ental Status: Normal mood and affect. Normal behavior. Normal judgment and thought content.     Assessment   34 y.o. [id number] at [redacted]w[redacted]d  by  10/20/2020, by Ultrasound presenting for routine prenatal visit  Plan   Pregnancy#3 Problems (from 01/14/20 to present)    Problem Noted Resolved   Supervision of other high risk pregnancies, third trimester 03/09/2020 by Tresea Mall, CNM No   Overview Addendum 09/01/2020  5:21 AM by Natale Milch, MD     Nursing Staff Provider  Office Location  [facility] Dating   7 wk [place]  Language  English Anatomy US   complete, placenta previa with fetal vessel   Flu Vaccine  07/26/2020 Genetic Screen  NIPS:  Normal, gender surprise   TDaP vaccine  08/23/20  Hgb A1C or  GTT Third trimester : normal  Rhogam   not needed   LAB RESULTS   Feeding Plan formula Blood Type O/Positive/-- (07/01 1447)   Contraception BTL consent signed 11/18 Antibody Negative (07/01 1447)  Circumcision  Rubella 2.18 (07/01 1447)  Pediatrician   RPR Non Reactive (10/21 1537)   Support Person  partner John HBsAg Negative (07/01 1447)   Prenatal Classes  HIV Non Reactive (10/21 1537)    Varicella immune  BTL Consent Signed 11/18 GBS  (For PCN allergy, check sensitivities)        VBAC Consent  Pap  2021 NIL    Hgb Electro   abnormal    CF      SMA          High Risk pregnancy Diagnoses Vasa previa vs. Placenta previa- MFM referral Anemia in pregnancy, persistent hgb F- hematology referral placed        Previous Version   Placenta previa antepartum in third trimester 08/23/2020 by Tresea Mall, CNM 09/21/2020 by Tresea Mall, CNM   Overview Signed 09/14/2020 11:59 AM by Conard Novak, MD    - resolve. See MFM note  Placenta is anterior and on transabdominal  ultrasound, no placenta previa is seen.  A thin membranous portion (chorion) of the placenta is seen 1.4 cm from the internal os. Color- Doppler did not show any fetal or maternal vessels near the internal os. The thick placental edge is 2.4 cm from the internal os. To facilitate proper examination, the fetal head was displaced upward (by  the MD).  The cervix looks long and closed.  Counseling as follows:  -Although a small membranous portion of the placenta is closer to the internal os, it is unlikely to cause hemorrhage during labor. Descent of fetal head is likely to compress the placental edge and she should be able to deliver without any complications.  -The thicker placental (vascular) edge is not low lying. No evidence of vasa previa.  -Vaginal delivery is highly likely.  -Patient can await spontaneous onset of labor. Alternatively, induction of labor may be considered after 39 weeks if cervix is favorable and patient desires planned delivery.  -Spontaneous labor is accompanied by some amount of vaginal bleeding.  Cesarean section would be considered if    -Patient has significant vaginal bleeding at or after 36 weeks'  gestation. Inpatient management would be recommended if patient has vaginal bleeding before 36 weeks' gestation.   -Patient has significant bleeding during labor that cannot be accounted for normal amount of bleeding that accompanies labor. ---------------------------------------------------------------------- Recommendations  -Follow-up scans as clinically indicated.  -Vaginal delivery can be attempted and cesarean section for  reasons mentioned above.  -Blood to be available at short noticd during delivery (iron  deficiency anemia).         -F/u with hem on 12/30 - patient reports fe infusion seem to help with fatigue  Term labor precautions including but not limited to vaginal bleeding, contractions, leaking of fluid and fetal movement were reviewed in detail with the patient.    Return in about 1 week (around 10/08/2020) for ROB.  Zipporah Plants, CNM, MSN [facility], [facility] 10/01/2020, 3:28 PM

## 2020-10-04 ENCOUNTER — Inpatient Hospital Stay: Payer: Medicaid Other

## 2020-10-04 VITALS — BP 101/60 | HR 74 | Temp 97.4°F | Resp 16

## 2020-10-04 DIAGNOSIS — F17210 Nicotine dependence, cigarettes, uncomplicated: Secondary | ICD-10-CM | POA: Diagnosis not present

## 2020-10-04 DIAGNOSIS — Z331 Pregnant state, incidental: Secondary | ICD-10-CM | POA: Diagnosis not present

## 2020-10-04 DIAGNOSIS — Z79899 Other long term (current) drug therapy: Secondary | ICD-10-CM | POA: Diagnosis not present

## 2020-10-04 DIAGNOSIS — D649 Anemia, unspecified: Secondary | ICD-10-CM | POA: Diagnosis not present

## 2020-10-04 DIAGNOSIS — D508 Other iron deficiency anemias: Secondary | ICD-10-CM

## 2020-10-04 DIAGNOSIS — G40909 Epilepsy, unspecified, not intractable, without status epilepticus: Secondary | ICD-10-CM | POA: Diagnosis not present

## 2020-10-04 DIAGNOSIS — D509 Iron deficiency anemia, unspecified: Secondary | ICD-10-CM | POA: Diagnosis not present

## 2020-10-04 MED ORDER — IRON SUCROSE 20 MG/ML IV SOLN
200.0000 mg | Freq: Once | INTRAVENOUS | Status: AC
  Administered 2020-10-04: 200 mg via INTRAVENOUS
  Filled 2020-10-04: qty 10

## 2020-10-04 MED ORDER — SODIUM CHLORIDE 0.9 % IV SOLN
Freq: Once | INTRAVENOUS | Status: AC
  Filled 2020-10-04: qty 250

## 2020-10-04 MED ORDER — SODIUM CHLORIDE 0.9 % IV SOLN: 200.0000 mg | Freq: Once | INTRAVENOUS | Status: DC

## 2020-10-04 NOTE — Progress Notes (Signed)
Patient tolerated infusion well, no concerns voiced. Patient discharged. Stable. 

## 2020-10-06 DIAGNOSIS — Z419 Encounter for procedure for purposes other than remedying health state, unspecified: Secondary | ICD-10-CM | POA: Diagnosis not present

## 2020-10-08 ENCOUNTER — Other Ambulatory Visit: Payer: Self-pay

## 2020-10-08 ENCOUNTER — Ambulatory Visit (INDEPENDENT_AMBULATORY_CARE_PROVIDER_SITE_OTHER): Payer: Medicaid Other | Admitting: Obstetrics and Gynecology

## 2020-10-08 ENCOUNTER — Encounter: Payer: Self-pay | Admitting: Obstetrics and Gynecology

## 2020-10-08 VITALS — BP 122/74 | Wt 178.0 lb

## 2020-10-08 DIAGNOSIS — Z3A38 38 weeks gestation of pregnancy: Secondary | ICD-10-CM

## 2020-10-08 DIAGNOSIS — O0993 Supervision of high risk pregnancy, unspecified, third trimester: Secondary | ICD-10-CM

## 2020-10-08 NOTE — Progress Notes (Signed)
  [facility] INDUCTION ASSESSMENT Tammy Frey Oct 13, 1985 Medical record #: [id number] Phone #:  Home Phone [phone]  Mobile [phone]  Mobile [phone]    Prenatal Provider:[facility] Delivering Group:[facility] Proposed admission date/time: 10/13/20 at 0800 Method of induction:Cytotec  Weight: Filed Weights01/03/22 1414Weight:178 lb (80.7 kg) BMI Body mass index is 27.88 kg/m. HIV Negative HSV Negative EDC Estimated Date of Delivery: 1/15/22based on:US at [redacted] wks  Gestational age on admission: 39 weeks Gravidity/parity:G4P1021  Cervix Score   0 1 2 3   Position Posterior Midposition Anterior   Consistency Firm Medium Soft   Effacement (%) 0-30 40-50 60-70 >80  Dilation (cm) Closed 1-2 3-4 >5  Baby's station -3 -2 -1 +1, +2   Bishop Score:6  Medical induction of labor  select indication(s) below Elective induction ?39 weeks multiparous patient  Medical Indications Adapted from ACOG Committee Opinion #[id number], "Medically Indicated Late Preterm and Early Term Deliveries," 2013.  PLACENTAL / UTERINE ISSUES FETAL ISSUES MATERNAL ISSUES  ? Placenta previa (36.0-37.6) ? Isoimmunization (37.0-38.6) ? Preeclampsia without severe features or gestational HTN (37.0)  ? 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 Growth Restriction 11-27-1984) ? Preeclampsia with severe features (34.0)  ? Prior classical CD, uterine window, rupture (36.0-37.6) ? Isolated (38.0-39.6) ? Chronic HTN (38.0-39.6)  ? Prior myomectomy (37.0-38.6) ? Concurrent findings (34.0-37.6) ? 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 Umbilical vein varix (37.0) ? Growth Restriction (Twins) ? Diabetes  ? Placental abruption (chronic) ? Di-Di Isolated (36.0-37.6) ? Pregestational, controlled (39.0)  OBSTETRIC ISSUES ? Di-Di concurrent findings (32.0-34.6) ? Pregestational, uncontrolled (37.0-39.0)  ? Postdates ? (41 weeks) ? Mo-Di isolated (32.0-34.6) ? Pregestational, vascular compromise (37.0- 39.0)  ? PPROM (34.0)  ? Multiple Gestation ? Gestational, diet controlled (40.0)  ? Hx of IUFD (39.0 weeks) ? Di-Di (38.0-38.6) ? Gestational, med controlled (39.0)  ? Polyhydramnios, mild/moderate; SDV 8-16 or AFI 25-35 (39.0) ? Mo-Di (36.0-37.6) ? Gestational, uncontrolled (38.0-39.0)  ? Oligohydramnios (36.0-37.6); MVP <2 cm  For indications not listed above, delivery recommendations from maternal-fetal medicine consultant occurred on: Date:n/a  Provider Signature: 12-23-1985 Scheduled by: Tammy Frey. RN Date:10/08/2020 3:16 PM   Call [phone] to finalize the induction date/time  [phone] (07/17)

## 2020-10-08 NOTE — Progress Notes (Signed)
Routine Prenatal Care Visit  Subjective  Tammy Frey is a 35 y.o. [phone] at [redacted]w[redacted]d being seen today for ongoing prenatal care.  She is currently monitored for the following issues for this high-risk pregnancy and has Supervision of other high risk pregnancies, third trimester; Uterine contractions; Preterm uterine contractions in third trimester, antepartum; Preterm contractions; and IDA (iron deficiency anemia) on their problem list.  ----------------------------------------------------------------------------------- Patient reports intermitten contractions. Patient states she is "miserable." Difficutly sleeping at night, uncomfortable, tired. Contractions: Irregular. Vag. Bleeding: None.  Movement: Present. Denies leaking of fluid.  ----------------------------------------------------------------------------------- The following portions of the patient's history were reviewed and updated as appropriate: allergies, current medications, past family history, past medical history, past social history, past surgical history and problem list. Problem list updated.   Objective  Blood pressure 122/74, weight 178 lb (80.7 kg). Pregravid weight 148 lb (67.1 kg) Total Weight Gain 30 lb (13.6 kg) Urinalysis:      Fetal Status: Fetal Heart Rate (bpm): 145 Fundal Height: 37 cm Movement: Present  Presentation: Vertex  General:  Alert, oriented and cooperative. Patient is in no acute distress.  Skin: Skin is warm and dry. No rash noted.   Cardiovascular: Normal heart rate noted  Respiratory: Normal respiratory effort, no problems with respiration noted  Abdomen: Soft, gravid, appropriate for gestational age. Pain/Pressure: Present     Pelvic:  Cervical exam performed Dilation: 1.5 Effacement (%): 60 Station: -2, mid-position, medium consistency  Extremities: Normal range of motion.  Edema: None  ental Status: Normal mood and affect. Normal behavior. Normal judgment and thought content.    Bishop score: 6  Assessment   35 y.o. [id number] at [redacted]w[redacted]d by  10/20/2020, by Ultrasound presenting for routine prenatal visit  Plan   Pregnancy#3 Problems (from 01/14/20 to present)    Problem Noted Resolved   Supervision of other high risk pregnancies, third trimester 03/09/2020 by Tresea Mall, CNM No   Overview Addendum 09/01/2020  5:21 AM by Natale Milch, MD     Nursing Staff Provider  Office Location  [facility] Dating   7 wk [place]  Language  English Anatomy US   complete, placenta previa with fetal vessel   Flu Vaccine  07/26/2020 Genetic Screen  NIPS:  Normal, gender surprise   TDaP vaccine  08/23/20  Hgb A1C or  GTT Third trimester : normal  Rhogam   not needed   LAB RESULTS   Feeding Plan formula Blood Type O/Positive/-- (07/01 1447)   Contraception BTL consent signed 11/18 Antibody Negative (07/01 1447)  Circumcision  Rubella 2.18 (07/01 1447)  Pediatrician   RPR Non Reactive (10/21 1537)   Support Person  partner John HBsAg Negative (07/01 1447)   Prenatal Classes  HIV Non Reactive (10/21 1537)    Varicella immune  BTL Consent Signed 11/18 GBS  (For PCN allergy, check sensitivities)        VBAC Consent  Pap  2021 NIL    Hgb Electro   abnormal    CF      SMA         High Risk pregnancy Diagnoses Vasa previa vs. Placenta previa- MFM referral Anemia in pregnancy, persistent hgb F- hematology referral placed      Previous Version   Placenta previa antepartum in third trimester 08/23/2020 by Tresea Mall, CNM 09/21/2020 by Tresea Mall, CNM   Overview Signed 09/14/2020 11:59 AM by Conard Novak, MD    - resolve. See MFM note  Placenta is anterior and  on transabdominal  ultrasound, no placenta previa is seen.  A thin membranous portion (chorion) of the placenta is seen 1.4 cm from the internal os. Color- Doppler did not show any fetal or maternal vessels near the internal os. The thick placental edge is 2.4 cm from the internal os. To facilitate  proper examination, the fetal head was displaced upward (by the MD).  The cervix looks long and closed.  Counseling as follows:  -Although a small membranous portion of the placenta is closer to the internal os, it is unlikely to cause hemorrhage during labor. Descent of fetal head is likely to compress the placental edge and she should be able to deliver without any complications.  -The thicker placental (vascular) edge is not low lying. No evidence of vasa previa.  -Vaginal delivery is highly likely.  -Patient can await spontaneous onset of labor. Alternatively, induction of labor may be considered after 39 weeks if cervix is favorable and patient desires planned delivery.  -Spontaneous labor is accompanied by some amount of vaginal bleeding.  Cesarean section would be considered if    -Patient has significant vaginal bleeding at or after 36 weeks'  gestation. Inpatient management would be recommended if patient has vaginal bleeding before 36 weeks' gestation.   -Patient has significant bleeding during labor that cannot be accounted for normal amount of bleeding that accompanies labor. ---------------------------------------------------------------------- Recommendations  -Follow-up scans as clinically indicated.  -Vaginal delivery can be attempted and cesarean section for  reasons mentioned above.  -Blood to be available at short noticd during delivery (iron  deficiency anemia).         -Reviewed risks vs benefits of eIOL with patient. Patient desires to proceed with IOL at 39 weeks.  Term labor precautions including but not limited to vaginal bleeding, contractions, leaking of fluid and fetal movement were reviewed in detail with the patient.    Instructions for Covid testing - present on 10/11/20.  Present to L&D for eIOL on 10/13/20 at 0800.  Zipporah Plants, CNM, MSN [facility], [facility] 10/08/2020, 3:12 PM

## 2020-10-08 NOTE — Progress Notes (Signed)
History and Physical  Tammy Frey is a 35 y.o. [id number] [redacted]w[redacted]d  for Induction of Labor scheduled due to Favorable cervix at term .   Pregnancy course complicated by iron deficiency anemia (s/p hematology consult and Fe infusions; most recent infusiont was 10/04/20), placenta previa that resolved at 34 weeks (per MFM [place] on 09/13/20 - ok to delivery vaginally).   Patient reports irregular contractions over the last several days. Patient denies pain, bleeding, ruptured membranes, or other signs of progressing labor at this time.  PMHx: She  has a past medical history of Anemia, IDA (iron deficiency anemia) (09/10/2020), and Seizures (HCC). Also,  has no past surgical history on file., family history includes Breast cancer in her paternal aunt; Cancer in her paternal grandfather; Diabetes in her father; Lung cancer in her paternal uncle.,  reports that she has been smoking. She has smoked for the past 20.00 years. She has never used smokeless tobacco. She reports previous alcohol use. She reports current drug use. Drug: Marijuana. She has a current medication list which includes the following prescription(s): docusate sodium, famotidine, ferrous sulfate, and prenatal mv & min w/fa-dha. Also, is allergic to haldol [haloperidol]. OB History  Gravida Para Term Preterm AB Living  4 1 1   2 1   SAB IAB Ectopic Multiple Live Births    2     1    # Outcome Date GA Lbr Len/2nd Weight Sex Delivery Anes PTL Lv  4 Current           3 IAB 2013          2 Term 03/01/08   7 lb 4 oz (3.289 kg) F Vag-Spont  N LIV  1 IAB 2006          Patient denies any other pertinent gynecologic issues.   Review of Systems  Constitutional: Positive for malaise/fatigue. Negative for fever.  HENT: Negative.   Eyes: Negative.   Respiratory: Negative.   Cardiovascular: Negative.   Gastrointestinal: Positive for abdominal pain. Negative for diarrhea, nausea and vomiting.       Abd pain r/t intermittent contractions   Genitourinary: Negative.   Musculoskeletal: Positive for back pain. Negative for myalgias and neck pain.  Skin: Negative.   Neurological: Negative.   Endo/Heme/Allergies: Negative.   Psychiatric/Behavioral: Negative.     Objective: BP 122/74   Wt 178 lb (80.7 kg)   LMP  (LMP Unknown)   BMI 27.88 kg/m  Physical Exam Constitutional:      Appearance: Normal appearance.  Genitourinary:     Genitourinary Comments: SVE: 1.5cm/60%/-2/mid/med-soft  HENT:     Head: Normocephalic.  Cardiovascular:     Rate and Rhythm: Normal rate.  Pulmonary:     Effort: Pulmonary effort is normal.  Abdominal:     Comments: Gravid, non-tender  Musculoskeletal:     Cervical back: Normal range of motion.  Neurological:     Mental Status: She is alert.     Assessment: Term Pregnancy for Induction of Labor due to Favorable cervix at term.  Plan: Patient will undergo induction of labor with cervical ripening agents, pitocin.     Patient has been fully informed of the pros and cons, risks and benefits of continued observation with fetal monitoring versus that of induction of labor.   She understands that there are uncommon risks to induction, which include but are not limited to : frequent or prolonged uterine contractions, fetal distress, uterine rupture, and lack of successful induction.  These risks include  all methods including Pitocin and Misoprostol.  Patient understands that using Misoprostol for labor induction is an "off label" indication although it has been studied extensively for this purpose and is an accepted method of induction.  She also has been informed of the increased risks for Cesarean with induction and should induction not be successful.  Patient consents to the induction plan of management.  Plans to formula feed Plans bilateral tubal ligation for contraception TDaP UTD - 08/23/20  Zipporah Plants, CNM, MSN [facility], [facility] 10/08/2020  3:20 PM

## 2020-10-08 NOTE — Patient Instructions (Signed)
PRE ADMISSION TESTING For Covid, prior to procedure Thursday 9:00-10:00 [facility] entrance (drive up)  Results in 78-29 hours You will not receive notification if test results are negative. If positive for Covid19, your provider will notify you by phone, with additional instructions.    Labor Induction  Labor induction is when steps are taken to cause a pregnant woman to begin the labor process. Most women go into labor on their own between 37 weeks and 42 weeks of pregnancy. When this does not happen or when there is a medical need for labor to begin, steps may be taken to induce labor. Labor induction causes a pregnant woman's uterus to contract. It also causes the cervix to soften (ripen), open (dilate), and thin out (efface). Usually, labor is not induced before 39 weeks of pregnancy unless there is a medical reason to do so. Your health care provider will determine if labor induction is needed. Before inducing labor, your health care provider will consider a number of factors, including:  Your medical condition and your baby's.  How many weeks along you are in your pregnancy.  How mature your baby's lungs are.  The condition of your cervix.  The position of your baby.  The size of your birth canal. What are some reasons for labor induction? Labor may be induced if:  Your health or your baby's health is at risk.  Your pregnancy is overdue by 1 week or more.  Your water breaks but labor does not start on its own.  There is a low amount of amniotic fluid around your baby. You may also choose (elect) to have labor induced at a certain time. Generally, elective labor induction is done no earlier than 39 weeks of pregnancy. What methods are used for labor induction? Methods used for labor induction include:  Prostaglandin medicine. This medicine starts contractions and causes the cervix to dilate and ripen. It can be taken by mouth (orally) or by being inserted  into the vagina (suppository).  Inserting a small, thin tube (catheter) with a balloon into the vagina and then expanding the balloon with water to dilate the cervix.  Stripping the membranes. In this method, your health care provider gently separates amniotic sac tissue from the cervix. This causes the cervix to stretch, which in turn causes the release of a hormone called progesterone. The hormone causes the uterus to contract. This procedure is often done during an office visit, after which you will be sent home to wait for contractions to begin.  Breaking the water. In this method, your health care provider uses a small instrument to make a small hole in the amniotic sac. This eventually causes the amniotic sac to break. Contractions should begin after a few hours.  Medicine to trigger or strengthen contractions. This medicine is given through an IV that is inserted into a vein in your arm. Except for membrane stripping, which can be done in a clinic, labor induction is done in the hospital so that you and your baby can be carefully monitored. How long does it take for labor to be induced? The length of time it takes to induce labor depends on how ready your body is for labor. Some inductions can take up to 2-3 days, while others may take less than a day. Induction may take longer if:  You are induced early in your pregnancy.  It is your first pregnancy.  Your cervix is not ready. What are some risks associated with labor induction? Some  risks associated with labor induction include:  Changes in fetal heart rate, such as being too high, too low, or irregular (erratic).  Failed induction.  Infection in the mother or the baby.  Increased risk of having a cesarean delivery.  Fetal death.  Breaking off (abruption) of the placenta from the uterus (rare).  Rupture of the uterus (very rare). When induction is needed for medical reasons, the benefits of induction generally outweigh the  risks. What are some reasons for not inducing labor? Labor induction should not be done if:  Your baby does not tolerate contractions.  You have had previous surgeries on your uterus, such as a myomectomy, removal of fibroids, or a vertical scar from a previous cesarean delivery.  Your placenta lies very low in your uterus and blocks the opening of the cervix (placenta previa).  Your baby is not in a head-down position.  The umbilical cord drops down into the birth canal in front of the baby.  There are unusual circumstances, such as the baby being very early (premature).  You have had more than 2 previous cesarean deliveries. Summary  Labor induction is when steps are taken to cause a pregnant woman to begin the labor process.  Labor induction causes a pregnant woman's uterus to contract. It also causes the cervix to ripen, dilate, and efface.  Labor is not induced before 39 weeks of pregnancy unless there is a medical reason to do so.  When induction is needed for medical reasons, the benefits of induction generally outweigh the risks. This information is not intended to replace advice given to you by your health care provider. Make sure you discuss any questions you have with your health care provider. Document Revised: 09/25/2017 Document Reviewed: 11/05/2016 Elsevier Patient Education  2020 [employer].

## 2020-10-11 ENCOUNTER — Other Ambulatory Visit: Payer: Self-pay

## 2020-10-11 ENCOUNTER — Other Ambulatory Visit
Admission: RE | Admit: 2020-10-11 | Discharge: 2020-10-11 | Disposition: A | Discharge status: Home / Self Care | Payer: Medicaid Other | Source: Ambulatory Visit | Attending: Obstetrics & Gynecology | Admitting: Obstetrics & Gynecology

## 2020-10-11 DIAGNOSIS — Z20822 Contact with and (suspected) exposure to covid-19: Secondary | ICD-10-CM | POA: Diagnosis not present

## 2020-10-11 DIAGNOSIS — Z01812 Encounter for preprocedural laboratory examination: Secondary | ICD-10-CM | POA: Diagnosis not present

## 2020-10-11 LAB — SARS CORONAVIRUS 2 (TAT 6-24 HRS): SARS Coronavirus 2: NEGATIVE

## 2020-10-13 ENCOUNTER — Inpatient Hospital Stay: Payer: Medicaid Other | Admitting: Anesthesiology

## 2020-10-13 ENCOUNTER — Encounter
Admission: EM | Disposition: A | Discharge status: Home / Self Care | Payer: Self-pay | Source: Home / Self Care | Attending: Obstetrics & Gynecology

## 2020-10-13 ENCOUNTER — Inpatient Hospital Stay
Admission: EM | Admit: 2020-10-13 | Discharge: 2020-10-15 | DRG: 785 | Disposition: A | Discharge status: Home / Self Care | Payer: Medicaid Other | Attending: Obstetrics & Gynecology | Admitting: Obstetrics & Gynecology

## 2020-10-13 ENCOUNTER — Encounter: Payer: Self-pay | Admitting: Obstetrics & Gynecology

## 2020-10-13 ENCOUNTER — Other Ambulatory Visit: Payer: Self-pay

## 2020-10-13 DIAGNOSIS — O99891 Other specified diseases and conditions complicating pregnancy: Secondary | ICD-10-CM | POA: Insufficient documentation

## 2020-10-13 DIAGNOSIS — G8918 Other acute postprocedural pain: Secondary | ICD-10-CM | POA: Diagnosis not present

## 2020-10-13 DIAGNOSIS — I498 Other specified cardiac arrhythmias: Secondary | ICD-10-CM | POA: Diagnosis not present

## 2020-10-13 DIAGNOSIS — Z3490 Encounter for supervision of normal pregnancy, unspecified, unspecified trimester: Secondary | ICD-10-CM

## 2020-10-13 DIAGNOSIS — D509 Iron deficiency anemia, unspecified: Secondary | ICD-10-CM | POA: Diagnosis present

## 2020-10-13 DIAGNOSIS — O26833 Pregnancy related renal disease, third trimester: Secondary | ICD-10-CM | POA: Insufficient documentation

## 2020-10-13 DIAGNOSIS — Z3A39 39 weeks gestation of pregnancy: Secondary | ICD-10-CM

## 2020-10-13 DIAGNOSIS — O9902 Anemia complicating childbirth: Secondary | ICD-10-CM | POA: Diagnosis not present

## 2020-10-13 DIAGNOSIS — R1084 Generalized abdominal pain: Secondary | ICD-10-CM | POA: Diagnosis not present

## 2020-10-13 DIAGNOSIS — O09893 Supervision of other high risk pregnancies, third trimester: Secondary | ICD-10-CM

## 2020-10-13 DIAGNOSIS — R109 Unspecified abdominal pain: Secondary | ICD-10-CM | POA: Insufficient documentation

## 2020-10-13 DIAGNOSIS — Z302 Encounter for sterilization: Secondary | ICD-10-CM

## 2020-10-13 DIAGNOSIS — N200 Calculus of kidney: Secondary | ICD-10-CM | POA: Insufficient documentation

## 2020-10-13 DIAGNOSIS — O26893 Other specified pregnancy related conditions, third trimester: Secondary | ICD-10-CM | POA: Diagnosis not present

## 2020-10-13 LAB — CBC
HCT: 30 % — ABNORMAL LOW (ref 36.0–46.0)
Hemoglobin: 9.9 g/dL — ABNORMAL LOW (ref 12.0–15.0)
MCH: 30.7 pg (ref 26.0–34.0)
MCHC: 33 g/dL (ref 30.0–36.0)
MCV: 93.2 fL (ref 80.0–100.0)
Platelets: 227 10*3/uL (ref 150–400)
RBC: 3.22 MIL/uL — ABNORMAL LOW (ref 3.87–5.11)
RDW: 14.4 % (ref 11.5–15.5)
WBC: 7.4 10*3/uL (ref 4.0–10.5)
nRBC: 0 % (ref 0.0–0.2)

## 2020-10-13 LAB — TYPE AND SCREEN
ABO/RH(D): O POS
Antibody Screen: NEGATIVE

## 2020-10-13 SURGERY — Surgical Case
Anesthesia: Spinal

## 2020-10-13 MED ORDER — BUPIVACAINE IN DEXTROSE 0.75-8.25 % IT SOLN
INTRATHECAL | Status: DC | PRN
  Administered 2020-10-13: 1.6 mL via INTRATHECAL

## 2020-10-13 MED ORDER — PROMETHAZINE HCL 25 MG PO TABS: 25.0000 mg | ORAL_TABLET | Freq: Four times a day (QID) | ORAL | 2 refills | Status: DC | PRN

## 2020-10-13 MED ORDER — SOD CITRATE-CITRIC ACID 500-334 MG/5ML PO SOLN
30.0000 mL | ORAL | Status: AC
  Administered 2020-10-13: 30 mL via ORAL

## 2020-10-13 MED ORDER — SIMETHICONE 80 MG PO CHEW
80.0000 mg | CHEWABLE_TABLET | ORAL | Status: DC | PRN
  Administered 2020-10-14: 80 mg via ORAL
  Filled 2020-10-13: qty 1

## 2020-10-13 MED ORDER — FENTANYL CITRATE (PF) 100 MCG/2ML IJ SOLN: 25.0000 ug | INTRAMUSCULAR | Status: DC | PRN

## 2020-10-13 MED ORDER — NALOXONE HCL 4 MG/10ML IJ SOLN
1.0000 ug/kg/h | INTRAVENOUS | Status: DC | PRN
  Filled 2020-10-13: qty 5

## 2020-10-13 MED ORDER — LIDOCAINE HCL (PF) 1 % IJ SOLN: 30.0000 mL | INTRAMUSCULAR | Status: DC | PRN

## 2020-10-13 MED ORDER — MORPHINE SULFATE (PF) 2 MG/ML IV SOLN: 1.0000 mg | INTRAVENOUS | Status: DC | PRN

## 2020-10-13 MED ORDER — IBUPROFEN 800 MG PO TABS: 800.0000 mg | ORAL_TABLET | Freq: Four times a day (QID) | ORAL | Status: DC

## 2020-10-13 MED ORDER — NALOXONE HCL 0.4 MG/ML IJ SOLN
0.4000 mg | INTRAMUSCULAR | Status: DC | PRN
Start: 2020-10-13 — End: 2020-10-15

## 2020-10-13 MED ORDER — NALBUPHINE HCL 10 MG/ML IJ SOLN: 5.0000 mg | Freq: Once | INTRAMUSCULAR | Status: DC | PRN

## 2020-10-13 MED ORDER — ACETAMINOPHEN 325 MG PO TABS
650.0000 mg | ORAL_TABLET | ORAL | Status: DC | PRN
  Administered 2020-10-14 – 2020-10-15 (×4): 650 mg via ORAL
  Filled 2020-10-13 (×4): qty 2

## 2020-10-13 MED ORDER — SODIUM CHLORIDE 0.9 % IV SOLN
2.0000 g | INTRAVENOUS | Status: AC
  Administered 2020-10-13: 2 g via INTRAVENOUS

## 2020-10-13 MED ORDER — LACTATED RINGERS IV SOLN: INTRAVENOUS | Status: DC

## 2020-10-13 MED ORDER — COCONUT OIL OIL: 1.0000 "application " | TOPICAL_OIL | Status: DC | PRN

## 2020-10-13 MED ORDER — DIPHENHYDRAMINE HCL 50 MG/ML IJ SOLN: 12.5000 mg | INTRAMUSCULAR | Status: DC | PRN

## 2020-10-13 MED ORDER — ACETAMINOPHEN 325 MG PO TABS: 650.0000 mg | ORAL_TABLET | ORAL | Status: DC | PRN

## 2020-10-13 MED ORDER — ONDANSETRON HCL 4 MG/2ML IJ SOLN
4.0000 mg | Freq: Three times a day (TID) | INTRAMUSCULAR | Status: DC | PRN
  Administered 2020-10-13: 4 mg via INTRAVENOUS
  Filled 2020-10-13: qty 2

## 2020-10-13 MED ORDER — TERBUTALINE SULFATE 1 MG/ML IJ SOLN: 0.2500 mg | Freq: Once | INTRAMUSCULAR | Status: AC | PRN

## 2020-10-13 MED ORDER — BUPIVACAINE HCL 0.5 % IJ SOLN
10.0000 mL | Freq: Once | INTRAMUSCULAR | Status: DC
  Filled 2020-10-13: qty 10

## 2020-10-13 MED ORDER — SODIUM CHLORIDE 0.9% FLUSH: 3.0000 mL | INTRAVENOUS | Status: DC | PRN

## 2020-10-13 MED ORDER — SOD CITRATE-CITRIC ACID 500-334 MG/5ML PO SOLN
30.0000 mL | ORAL | Status: DC | PRN
  Filled 2020-10-13: qty 15

## 2020-10-13 MED ORDER — SODIUM CHLORIDE 0.9 % IV SOLN
INTRAVENOUS | Status: DC | PRN
  Administered 2020-10-13: 30 mL/h via INTRAVENOUS

## 2020-10-13 MED ORDER — BUPIVACAINE HCL (PF) 0.5 % IJ SOLN
INTRAMUSCULAR | Status: DC | PRN
  Administered 2020-10-13: 10 mL

## 2020-10-13 MED ORDER — POVIDONE-IODINE 10 % EX SWAB: 2.0000 "application " | Freq: Once | CUTANEOUS | Status: DC

## 2020-10-13 MED ORDER — NALBUPHINE HCL 10 MG/ML IJ SOLN: 5.0000 mg | INTRAMUSCULAR | Status: DC | PRN

## 2020-10-13 MED ORDER — TERBUTALINE SULFATE 1 MG/ML IJ SOLN
INTRAMUSCULAR | Status: AC
  Administered 2020-10-13: 0.25 mg via SUBCUTANEOUS
  Filled 2020-10-13: qty 1

## 2020-10-13 MED ORDER — COVID-19 MRNA VACCINE (PFIZER) 30 MCG/0.3ML IM SUSP
0.3000 mL | Freq: Once | INTRAMUSCULAR | Status: AC
  Administered 2020-10-15: 0.3 mL via INTRAMUSCULAR
  Filled 2020-10-13 (×2): qty 0.3

## 2020-10-13 MED ORDER — SODIUM CHLORIDE 0.9 % IV SOLN
INTRAVENOUS | Status: AC
  Filled 2020-10-13: qty 2

## 2020-10-13 MED ORDER — DIPHENHYDRAMINE HCL 25 MG PO CAPS: 25.0000 mg | ORAL_CAPSULE | ORAL | Status: DC | PRN

## 2020-10-13 MED ORDER — OXYTOCIN-SODIUM CHLORIDE 30-0.9 UT/500ML-% IV SOLN
2.5000 [IU]/h | INTRAVENOUS | Status: AC
  Administered 2020-10-13: 2.5 [IU]/h via INTRAVENOUS
  Filled 2020-10-13: qty 500

## 2020-10-13 MED ORDER — DIBUCAINE (PERIANAL) 1 % EX OINT
1.0000 | TOPICAL_OINTMENT | CUTANEOUS | Status: DC | PRN
Start: 2020-10-13 — End: 2020-10-15

## 2020-10-13 MED ORDER — KETOROLAC TROMETHAMINE 30 MG/ML IJ SOLN: 30.0000 mg | Freq: Four times a day (QID) | INTRAMUSCULAR | Status: DC | PRN

## 2020-10-13 MED ORDER — BUPIVACAINE 0.25 % ON-Q PUMP DUAL CATH 400 ML
400.0000 mL | INJECTION | Status: DC
  Filled 2020-10-13: qty 400

## 2020-10-13 MED ORDER — FENTANYL CITRATE (PF) 100 MCG/2ML IJ SOLN
INTRAMUSCULAR | Status: AC
  Filled 2020-10-13: qty 2

## 2020-10-13 MED ORDER — HYDROCODONE-ACETAMINOPHEN 5-325 MG PO TABS: 1.0000 | ORAL_TABLET | Freq: Four times a day (QID) | ORAL | 0 refills | Status: DC | PRN

## 2020-10-13 MED ORDER — OXYTOCIN-SODIUM CHLORIDE 30-0.9 UT/500ML-% IV SOLN
2.5000 [IU]/h | INTRAVENOUS | Status: DC
  Administered 2020-10-13: 10 [IU]/h via INTRAVENOUS
  Filled 2020-10-13: qty 500

## 2020-10-13 MED ORDER — ONDANSETRON HCL 4 MG/2ML IJ SOLN: 4.0000 mg | Freq: Four times a day (QID) | INTRAMUSCULAR | Status: DC | PRN

## 2020-10-13 MED ORDER — PROMETHAZINE HCL 25 MG/ML IJ SOLN: 6.2500 mg | INTRAMUSCULAR | Status: DC | PRN

## 2020-10-13 MED ORDER — DIPHENHYDRAMINE HCL 25 MG PO CAPS: 25.0000 mg | ORAL_CAPSULE | Freq: Four times a day (QID) | ORAL | Status: DC | PRN

## 2020-10-13 MED ORDER — BUTORPHANOL TARTRATE 1 MG/ML IJ SOLN: 1.0000 mg | INTRAMUSCULAR | Status: DC | PRN

## 2020-10-13 MED ORDER — FENTANYL CITRATE (PF) 100 MCG/2ML IJ SOLN
INTRAMUSCULAR | Status: DC | PRN
  Administered 2020-10-13: 15 ug via INTRATHECAL

## 2020-10-13 MED ORDER — WITCH HAZEL-GLYCERIN EX PADS: 1.0000 "application " | MEDICATED_PAD | CUTANEOUS | Status: DC | PRN

## 2020-10-13 MED ORDER — OXYCODONE-ACETAMINOPHEN 5-325 MG PO TABS: 1.0000 | ORAL_TABLET | ORAL | Status: DC | PRN

## 2020-10-13 MED ORDER — MORPHINE SULFATE (PF) 0.5 MG/ML IJ SOLN
INTRAMUSCULAR | Status: AC
  Filled 2020-10-13: qty 10

## 2020-10-13 MED ORDER — SIMETHICONE 80 MG PO CHEW
80.0000 mg | CHEWABLE_TABLET | Freq: Three times a day (TID) | ORAL | Status: DC
  Administered 2020-10-13 – 2020-10-15 (×5): 80 mg via ORAL
  Filled 2020-10-13 (×5): qty 1

## 2020-10-13 MED ORDER — OXYTOCIN BOLUS FROM INFUSION: 333.0000 mL | Freq: Once | INTRAVENOUS | Status: DC

## 2020-10-13 MED ORDER — PRENATAL MULTIVITAMIN CH
1.0000 | ORAL_TABLET | Freq: Every day | ORAL | Status: DC
  Administered 2020-10-14: 1 via ORAL
  Filled 2020-10-13: qty 1

## 2020-10-13 MED ORDER — KETOROLAC TROMETHAMINE 30 MG/ML IJ SOLN
30.0000 mg | Freq: Four times a day (QID) | INTRAMUSCULAR | Status: DC
  Administered 2020-10-13 – 2020-10-14 (×3): 30 mg via INTRAVENOUS
  Filled 2020-10-13 (×3): qty 1

## 2020-10-13 MED ORDER — MEPERIDINE HCL 25 MG/ML IJ SOLN: 6.2500 mg | INTRAMUSCULAR | Status: DC | PRN

## 2020-10-13 MED ORDER — MISOPROSTOL 25 MCG QUARTER TABLET
25.0000 ug | ORAL_TABLET | ORAL | Status: DC | PRN
  Administered 2020-10-13: 25 ug via VAGINAL
  Filled 2020-10-13: qty 1

## 2020-10-13 MED ORDER — BUPIVACAINE HCL (PF) 0.5 % IJ SOLN
INTRAMUSCULAR | Status: AC
  Filled 2020-10-13: qty 30

## 2020-10-13 MED ORDER — ONDANSETRON 4 MG PO TBDP: 4.0000 mg | ORAL_TABLET | Freq: Four times a day (QID) | ORAL | 0 refills | Status: DC | PRN

## 2020-10-13 MED ORDER — MISOPROSTOL 25 MCG QUARTER TABLET
25.0000 ug | ORAL_TABLET | ORAL | Status: DC
  Administered 2020-10-13: 25 ug via BUCCAL
  Filled 2020-10-13: qty 1

## 2020-10-13 MED ORDER — SENNOSIDES-DOCUSATE SODIUM 8.6-50 MG PO TABS
2.0000 | ORAL_TABLET | ORAL | Status: DC
  Administered 2020-10-13 – 2020-10-14 (×2): 2 via ORAL
  Filled 2020-10-13 (×2): qty 2

## 2020-10-13 MED ORDER — LACTATED RINGERS IV SOLN
500.0000 mL | INTRAVENOUS | Status: DC | PRN
  Administered 2020-10-13: 500 mL via INTRAVENOUS

## 2020-10-13 MED ORDER — ZOLPIDEM TARTRATE 5 MG PO TABS: 5.0000 mg | ORAL_TABLET | Freq: Every evening | ORAL | Status: DC | PRN

## 2020-10-13 MED ORDER — MORPHINE SULFATE (PF) 0.5 MG/ML IJ SOLN
INTRAMUSCULAR | Status: DC | PRN
  Administered 2020-10-13: .1 mg via INTRATHECAL

## 2020-10-13 MED ORDER — BUPIVACAINE ON-Q PAIN PUMP (FOR ORDER SET NO CHG): INJECTION | Status: DC

## 2020-10-13 MED ORDER — MENTHOL 3 MG MT LOZG
1.0000 | LOZENGE | OROMUCOSAL | Status: DC | PRN
  Filled 2020-10-13: qty 9

## 2020-10-13 SURGICAL SUPPLY — 28 items
CANISTER SUCT 3000ML PPV (MISCELLANEOUS) ×3 IMPLANT
CATH KIT ON-Q SILVERSOAK 5IN (CATHETERS) ×6 IMPLANT
CHLORAPREP W/TINT 26 (MISCELLANEOUS) ×6 IMPLANT
COVER WAND RF STERILE (DRAPES) ×3 IMPLANT
DERMABOND ADVANCED (GAUZE/BANDAGES/DRESSINGS) ×1
DERMABOND ADVANCED .7 DNX12 (GAUZE/BANDAGES/DRESSINGS) ×2 IMPLANT
DRSG OPSITE POSTOP 4X10 (GAUZE/BANDAGES/DRESSINGS) ×3 IMPLANT
ELECT CAUTERY BLADE 6.4 (BLADE) ×3 IMPLANT
ELECT REM PT RETURN 9FT ADLT (ELECTROSURGICAL) ×3
ELECTRODE REM PT RTRN 9FT ADLT (ELECTROSURGICAL) ×2 IMPLANT
GLOVE SKINSENSE NS SZ8.0 LF (GLOVE) ×1
GLOVE SKINSENSE STRL SZ8.0 LF (GLOVE) ×2 IMPLANT
GOWN STRL REUS W/ TWL LRG LVL3 (GOWN DISPOSABLE) ×2 IMPLANT
GOWN STRL REUS W/ TWL XL LVL3 (GOWN DISPOSABLE) ×4 IMPLANT
GOWN STRL REUS W/TWL LRG LVL3 (GOWN DISPOSABLE) ×1
GOWN STRL REUS W/TWL XL LVL3 (GOWN DISPOSABLE) ×2
MANIFOLD NEPTUNE II (INSTRUMENTS) ×3 IMPLANT
NS IRRIG 1000ML POUR BTL (IV SOLUTION) ×3 IMPLANT
PACK C SECTION AR (MISCELLANEOUS) ×3 IMPLANT
PAD OB MATERNITY 4.3X12.25 (PERSONAL CARE ITEMS) ×3 IMPLANT
PAD PREP 24X41 OB/GYN DISP (PERSONAL CARE ITEMS) ×3 IMPLANT
PENCIL SMOKE ULTRAEVAC 22 CON (MISCELLANEOUS) ×3 IMPLANT
SUT MAXON ABS #0 GS21 30IN (SUTURE) ×6 IMPLANT
SUT VIC AB 1 CT1 36 (SUTURE) ×9 IMPLANT
SUT VIC AB 2-0 CT1 27 (SUTURE) ×1
SUT VIC AB 2-0 CT1 36 (SUTURE) ×3 IMPLANT
SUT VIC AB 2-0 CT1 TAPERPNT 27 (SUTURE) ×2 IMPLANT
SUT VIC AB 4-0 FS2 27 (SUTURE) ×3 IMPLANT

## 2020-10-13 NOTE — Discharge Instructions (Signed)
Cesarean Delivery, Care After This sheet gives you information about how to care for yourself after your procedure. Your health care provider may also give you more specific instructions. If you have problems or questions, contact your health care provider. What can I expect after the procedure? After the procedure, it is common to have:  A small amount of blood or clear fluid coming from the incision.  Some redness, swelling, and pain in your incision area.  Some abdominal pain and soreness.  Vaginal bleeding (lochia). Even though you did not have a vaginal delivery, you will still have vaginal bleeding and discharge.  Pelvic cramps.  Fatigue. You may have pain, swelling, and discomfort in the tissue between your vagina and your anus (perineum) if:  Your C-section was unplanned, and you were allowed to labor and push.  An incision was made in the area (episiotomy) or the tissue tore during attempted vaginal delivery. Follow these instructions at home: Incision care   Follow instructions from your health care provider about how to take care of your incision. Make sure you: ? Wash your hands with soap and water before you change your bandage (dressing). If soap and water are not available, use hand sanitizer. ? If you have a dressing, change it or remove it as told by your health care provider. ? Leave stitches (sutures), skin staples, skin glue, or adhesive strips in place. These skin closures may need to stay in place for 2 weeks or longer. If adhesive strip edges start to loosen and curl up, you may trim the loose edges. Do not remove adhesive strips completely unless your health care provider tells you to do that.  Check your incision area every day for signs of infection. Check for: ? More redness, swelling, or pain. ? More fluid or blood. ? Warmth. ? Pus or a bad smell.  Do not take baths, swim, or use a hot tub until your health care provider says it's okay. Ask your health  care provider if you can take showers.  When you cough or sneeze, hug a pillow. This helps with pain and decreases the chance of your incision opening up (dehiscing). Do this until your incision heals. Medicines  Take over-the-counter and prescription medicines only as told by your health care provider.  If you were prescribed an antibiotic medicine, take it as told by your health care provider. Do not stop taking the antibiotic even if you start to feel better.  Do not drive or use heavy machinery while taking prescription pain medicine. Lifestyle  Do not drink alcohol. This is especially important if you are breastfeeding or taking pain medicine.  Do not use any products that contain nicotine or tobacco, such as cigarettes, e-cigarettes, and chewing tobacco. If you need help quitting, ask your health care provider. Eating and drinking  Drink at least 8 eight-ounce glasses of water every day unless told not to by your health care provider. If you breastfeed, you may need to drink even more water.  Eat high-fiber foods every day. These foods may help prevent or relieve constipation. High-fiber foods include: ? Whole grain cereals and breads. ? Brown rice. ? Beans. ? Fresh fruits and vegetables. Activity   If possible, have someone help you care for your baby and help with household activities for at least a few days after you leave the hospital.  Return to your normal activities as told by your health care provider. Ask your health care provider what activities are safe for   you.  Rest as much as possible. Try to rest or take a nap while your baby is sleeping.  Do not lift anything that is heavier than 10 lbs (4.5 kg), or the limit that you were told, until your health care provider says that it is safe.  Talk with your health care provider about when you can engage in sexual activity. This may depend on your: ? Risk of infection. ? How fast you heal. ? Comfort and desire to  engage in sexual activity. General instructions  Do not use tampons or douches until your health care provider approves.  Wear loose, comfortable clothing and a supportive and well-fitting bra.  Keep your perineum clean and dry. Wipe from front to back when you use the toilet.  If you pass a blood clot, save it and call your health care provider to discuss. Do not flush blood clots down the toilet before you get instructions from your health care provider.  Keep all follow-up visits for you and your baby as told by your health care provider. This is important. Contact a health care provider if:  You have: ? A fever. ? Bad-smelling vaginal discharge. ? Pus or a bad smell coming from your incision. ? Difficulty or pain when urinating. ? A sudden increase or decrease in the frequency of your bowel movements. ? More redness, swelling, or pain around your incision. ? More fluid or blood coming from your incision. ? A rash. ? Nausea. ? Little or no interest in activities you used to enjoy. ? Questions about caring for yourself or your baby.  Your incision feels warm to the touch.  Your breasts turn red or become painful or hard.  You feel unusually sad or worried.  You vomit.  You pass a blood clot from your vagina.  You urinate more than usual.  You are dizzy or light-headed. Get help right away if:  You have: ? Pain that does not go away or get better with medicine. ? Chest pain. ? Difficulty breathing. ? Blurred vision or spots in your vision. ? Thoughts about hurting yourself or your baby. ? New pain in your abdomen or in one of your legs. ? A severe headache.  You faint.  You bleed from your vagina so much that you fill more than one sanitary pad in one hour. Bleeding should not be heavier than your heaviest period. Summary  After the procedure, it is common to have pain at your incision site, abdominal cramping, and slight bleeding from your vagina.  Check  your incision area every day for signs of infection.  Tell your health care provider about any unusual symptoms.  Keep all follow-up visits for you and your baby as told by your health care provider. This information is not intended to replace advice given to you by your health care provider. Make sure you discuss any questions you have with your health care provider. Document Revised: 03/31/2018 Document Reviewed: 03/31/2018 Elsevier Patient Education  2020 [employer]  

## 2020-10-13 NOTE — Discharge Summary (Addendum)
Postpartum Discharge Summary  Date of Service 10/13/2020    Patient Name: Tammy Frey DOB: 08-Nov-1985 MRN: [id number]  Date of admission: 10/13/2020 Delivery date:10/13/2020  Delivering provider: Gae Dry  Date of discharge: 10/15/2020  Admitting diagnosis: Pregnancy [Z34.90] Intrauterine pregnancy: [redacted]w[redacted]d    Secondary diagnosis:  Active Problems:   Pregnancy   Fetal intolerance to labor, delivered, current hospitalization   Admission for sterilization   Cesarean delivery, delivered, current hospitalization  Additional problems: None    Discharge diagnosis: Term Pregnancy Delivered                                              Post partum procedures:postpartum tubal ligation Augmentation: Cytotec Complications: None  Hospital course: Induction of Labor With Cesarean Section   35y.o. yo [id number] admitted to the hospital 10/13/2020 for induction of labor. Patient had a labor course significant for intolerence to ctx's early on with just one dose of Cytotec and no cervical change; decision made for CS.  Desired sterilization so that was accomplished as well.. The patient went for cesarean section due to Non-Reassuring FHR. Delivery details are as follows: Membrane Rupture Time/Date: 4:01 PM ,10/13/2020   Delivery Method:C-Section, Low Transverse  Details of operation can be found in separate operative Note.  Patient had an uncomplicated postpartum course. She is ambulating, tolerating a regular diet, passing flatus, and urinating well.  Patient is discharged home in stable condition on 10/15/20.      Newborn Data: Birth date:10/13/2020  Birth time:4:01 PM  Gender:Female  Living status:Living  Apgars:9 ,9  Weight:2430 g                                 Magnesium Sulfate received: No BMZ received: No Rhophylac:N/A MMR:No T-DaP:Given prenatally Flu: Yes Transfusion:No  Physical exam  Vitals:   10/14/20 1300 10/14/20 1521 10/15/20 0012 10/15/20 0900  BP:   120/61 118/75 115/78  Pulse: 65 (!) 58 (!) 51 62  Resp:  '18 18 18  ' Temp:  98.2 F (36.8 C) 98.7 F (37.1 C) 98.3 F (36.8 C)  TempSrc:  Oral Oral Oral  SpO2: 98% 100% 98% 99%  Weight:      Height:       General: alert, cooperative and no distress Lochia: appropriate Uterine Fundus: firm Incision: Healing well with no significant drainage, Dressing is clean, dry, and intact DVT Evaluation: No evidence of DVT seen on physical exam. No significant calf/ankle edema. Labs: Lab Results  Component Value Date   WBC 10.6 (H) 10/14/2020   HGB 8.9 (L) 10/14/2020   HCT 27.0 (L) 10/14/2020   MCV 94.1 10/14/2020   PLT 188 10/14/2020   CMP Latest Ref Rng & Units 08/16/2020  Glucose 70 - 99 mg/dL 97  BUN 6 - 20 mg/dL 11  Creatinine 0.44 - 1.00 mg/dL 0.42(L)  Sodium 135 - 145 mmol/L 134(L)  Potassium 3.5 - 5.1 mmol/L 3.6  Chloride 98 - 111 mmol/L 104  CO2 22 - 32 mmol/L 22  Calcium 8.9 - 10.3 mg/dL 8.8(L)  Total Protein 6.5 - 8.1 g/dL 6.9  Total Bilirubin 0.3 - 1.2 mg/dL 0.3  Alkaline Phos 38 - 126 U/L 70  AST 15 - 41 U/L 17  ALT 0 - 44 U/L 17  Edinburgh Score: Edinburgh Postnatal Depression Scale Screening Tool 10/15/2020  I have been able to laugh and see the funny side of things. 0  I have looked forward with enjoyment to things. 1  I have blamed myself unnecessarily when things went wrong. 3  I have been anxious or worried for no good reason. 2  I have felt scared or panicky for no good reason. 1  Things have been getting on top of me. 1  I have been so unhappy that I have had difficulty sleeping. 1  I have felt sad or miserable. 1  I have been so unhappy that I have been crying. 2  The thought of harming myself has occurred to me. 0  Edinburgh Postnatal Depression Scale Total 12      After visit meds:  Allergies as of 10/15/2020      Reactions   Haldol [haloperidol]    Dystonic reaction      Medication List    TAKE these medications   acetaminophen 325 MG  tablet Commonly known as: TYLENOL Take 2 tablets (650 mg total) by mouth every 4 (four) hours as needed for mild pain (temperature > 101.5.).   docusate sodium 100 MG capsule Commonly known as: COLACE Take 100 mg by mouth daily.   famotidine 20 MG tablet Commonly known as: PEPCID Take 20 mg by mouth 2 (two) times daily as needed for heartburn or indigestion.   ferrous sulfate 325 (65 FE) MG EC tablet Take 325 mg by mouth daily with breakfast.   HYDROcodone-acetaminophen 5-325 MG tablet Commonly known as: Norco Take 1 tablet by mouth every 6 (six) hours as needed for moderate pain.   ibuprofen 600 MG tablet Commonly known as: ADVIL Take 1 tablet (600 mg total) by mouth every 6 (six) hours.   ondansetron 4 MG disintegrating tablet Commonly known as: Zofran ODT Take 1 tablet (4 mg total) by mouth every 6 (six) hours as needed for nausea.   ONE A DAY PRENATAL PO Take by mouth.   promethazine 25 MG tablet Commonly known as: PHENERGAN Take 1 tablet (25 mg total) by mouth every 6 (six) hours as needed for nausea or vomiting.   simethicone 80 MG chewable tablet Commonly known as: MYLICON Chew 1 tablet (80 mg total) by mouth as needed for flatulence.        Discharge home in stable condition Infant Feeding: Bottle Infant Disposition:home with mother Discharge instruction: per After Visit Summary and Postpartum booklet. Activity: Advance as tolerated. Pelvic rest for 6 weeks.  Diet: routine diet Anticipated Birth Control: BTL DONE Postpartum Appointment:6 weeks Additional Postpartum F/U: Incision check 2 weeks Future Appointments: Future Appointments  Date Time Provider [place]  10/22/2020 11:00 AM Gae Dry, MD [facility] None  10/24/2020 10:45 AM [facility] None  10/26/2020  2:00 PM Earlie Server, MD [facility] None  10/26/2020  2:15 PM [facility] INFUSION CHAIR 7 [facility] None   Follow up Visit:  Follow-up Information    Gae Dry, MD.  Schedule an appointment as soon as possible for a visit in 2 week(s).   Specialty: Obstetrics and Gynecology Contact information: [address] [place] [address] [phone]                  10/15/2020 Orlie Pollen, CNM

## 2020-10-13 NOTE — Progress Notes (Signed)
  Labor Progress Note   35 y.o. [id number] @ [redacted]w[redacted]d , admitted for  Pregnancy, Labor Management.   Subjective:  Min pain.  We have noted decels with the FHR tracing, after her spon ctxs on Cytootec, becoming deeper as time has passed this past 30-60min  Objective:  BP 126/60 (BP Location: Left Arm)   Pulse 78   Temp 98.3 F (36.8 C) (Oral)   Resp 16   Ht 5\' 9"  (1.753 m)   Wt 79.4 kg   LMP  (LMP Unknown)   BMI 25.84 kg/m  Abd: gravid, ND, FHT present, mild tenderness on exam Extr: trace to 1+ bilateral pedal edema SVE: CERVIX: 1 cm dilated, 69 effaced, ballottable (NO CHANGE) station  EFM: FHR: 160 bpm, variability: moderate,  accelerations:  Present,  decelerations:  Present consisten even w position changes and IVF bolus.  Concern for lates. Toco: Frequency: Every 5-9 minutes Labs: I have reviewed the patient's lab results.   Assessment & Plan:  @ [redacted]w[redacted]d, admitted for  Pregnancy and Labor/Delivery Management  1. Pain management: none. 2. FWB: FHT category 2.  3. ID: GBS negative 4. Labor management: Early stages of IOL, and cervix still unchanged.  Rec CS for this pattern w fetal concerns of prolonged labor.  Pt also had been planning on a postpartum tubal procedure, and continues with this desire today.  The risks of cesarean section discussed with the patient included but were not limited to: bleeding which may require transfusion or reoperation; infection which may require antibiotics; injury to bowel, bladder, ureters or other surrounding organs; injury to the fetus; need for additional procedures including hysterectomy in the event of a life-threatening hemorrhage; placental abnormalities wth subsequent pregnancies, incisional problems, thromboembolic phenomenon and other postoperative/anesthesia complications. The patient concurred with the proposed plan, giving informed written consent for the procedure.   The patient has been fully informed about all methods of  contraception, both temporary and permanent. She understands that tubal ligation is meant to be permanent, absolute and irreversible. She was told that there is an approximately 1 in 400 chance of a pregnancy in the future after tubal ligation. She was told the short and long term complications of tubal ligation. She understands the risks from this surgery include, but are not limited to, the risks of anesthesia, hemorrhage, infection, perforation, and injury to adjacent structures, bowel, bladder and blood vessels.    All discussed with patient, see orders  [redacted]w[redacted]d, MD, Annamarie Major Ob/Gyn, [facility] 10/13/2020  1:06 PM

## 2020-10-13 NOTE — Transfer of Care (Signed)
Immediate Anesthesia Transfer of Care Note  Patient: Tammy Frey  Procedure(s) Performed: CESAREAN SECTION WITH BILATERAL TUBAL LIGATION  Patient Location: PACU  Anesthesia Type:Spinal  Level of Consciousness: awake  Airway & Oxygen Therapy: Patient Spontanous Breathing  Post-op Assessment: Report given to RN  Post vital signs: stable  Last Vitals:  Vitals Value Taken Time  BP    Temp    Pulse    Resp    SpO2      Last Pain:  Vitals:   10/13/20 1307  TempSrc: Oral  PainSc: 0-No pain         Complications: No complications documented.

## 2020-10-13 NOTE — Anesthesia Procedure Notes (Signed)
Spinal  Patient location during procedure: OR Start time: 10/13/2020 3:45 PM End time: 10/13/2020 3:50 PM Staffing Performed: anesthesiologist  Anesthesiologist: Martha Clan, MD Preanesthetic Checklist Completed: patient identified, IV checked, site marked, risks and benefits discussed, surgical consent, monitors and equipment checked, pre-op evaluation and timeout performed Spinal Block Patient position: sitting Prep: ChloraPrep Patient monitoring: heart rate, continuous pulse ox, blood pressure and cardiac monitor Approach: midline Location: L3-4 Injection technique: single-shot Needle Needle type: Whitacre and Introducer  Needle gauge: 24 G Needle length: 9 cm Assessment Sensory level: T6 Additional Notes Negative paresthesia. Negative blood return. Positive free-flowing CSF. Expiration date of kit checked and confirmed. Patient tolerated procedure well, without complications.

## 2020-10-13 NOTE — H&P (Signed)
History and Physical Interval Note:  10/13/2020 9:22 AM  Tammy Frey  has presented today for INDUCTION OF LABOR (cervical ripening agents),  with the diagnosis of Favorable cervix at term. The various methods of treatment have been discussed with the patient and family. After consideration of risks, benefits and other options for treatment, the patient has consented to  Labor induction .  The patient's history has been reviewed, patient examined, no change in status, and is stable for induction as planned.  See H&P. I have reviewed the patient's chart and labs.  Questions were answered to the patient's satisfaction.    Exam today 1/60/ballottable, Vtx Plan Cytotec  Annamarie Major, MD, Merlinda Frederick Ob/Gyn, [facility] 10/13/2020  9:22 AM

## 2020-10-13 NOTE — Op Note (Signed)
Cesarean Section Procedure Note Indications: non-reassuring fetal status and term intrauterine pregnancy, Desire for permanent sterility  Pre-operative Diagnosis:non-reassuring fetal status and term intrauterine pregnancy, Desire for permanent sterility Post-operative Diagnosis: same, delivered. Procedure: Low Transverse Cesarean Section, Bilateral Tubal Ligation Surgeon: Annamarie Major, MD Assistant(s): Heloise Ochoa, CNM    No other capable assistant available, in surgery requiring high level assistant. Anesthesia: Spinal anesthesia Estimated Blood Loss:700 mL Complications: None; patient tolerated the procedure well. Disposition: PACU - hemodynamically stable. Condition: stable  Findings: A female infant in the cephalic presentation. "Peighton" Amniotic fluid - Clear  Birth weight 5-6 lbs.  Apgars of 9 and 9.  Intact placenta with a three-vessel cord. Grossly normal uterus, tubes and ovaries bilaterally. No intraabdominal adhesions were noted.  Procedure Details   The patient was taken to Operating Room, identified as the correct patient and the procedure verified as C-Section Delivery. A Time Out was held and the above information confirmed. After induction of anesthesia, the patient was draped and prepped in the usual sterile manner. A Pfannenstiel incision was made and carried down through the subcutaneous tissue to the fascia. Fascial incision was made and extended transversely with the Mayo scissors. The fascia was separated from the underlying rectus tissue superiorly and inferiorly. The peritoneum was identified and entered bluntly. Peritoneal incision was extended longitudinally. The utero-vesical peritoneal reflection was incised transversely and a bladder flap was created digitally.  A low transverse hysterotomy was made. The fetus was delivered atraumatically. The umbilical cord was clamped x2 and cut and the infant was handed to the awaiting pediatricians. The placenta was  removed intact and appeared normal with a 3-vessel cord.  The uterus was exteriorized and cleared of all clot and debris. The hysterotomy was closed with running sutures of 0 Vicryl suture. A second imbricating layer was placed with the same suture. Excellent hemostasis was observed.   The left Fallopian tube was identified, grasped with the Babcock clamps, lifted to the skin incision and followed out distally to the fimbriae. An avascular midsection of the tube approximately 3-4cm from the cornua was grasped with the babcock clamps and brought into a knuckle at the skin incision. The tube was double ligated with 2-0 Vicryl suture and the intervening portion of tube was transected and removed. Excellent hemostasis was noted and the tube was returned to the abdomen. Attention was then turned to the right fallopian tube after confirmation of identification by tracing the tube out to the fimbriae. The same procedure was then performed on the right Fallopian tube. Again, excellent hemostasis was noted at the end of the procedure.  The uterus was returned to the abdomen. The pelvis was irrigated and again, excellent hemostasis was noted.  The On Q Pain pump System was then placed.  Trocars were placed through the abdominal wall into the subfascial space and these were used to thread the silver soaker cathaters into place.The rectus fascia was then reapproximated with running sutures of Maxon, with careful placement not to incorporate the cathaters. Subcutaneous tissues are then irrigated with saline and hemostasis assured.  Skin is then closed with 4-0 vicryl suture in a subcuticular fashion followed by skin adhesive.  The surgical assistant performed tissue retraction, assistance with suturing, and fundal pressure.    The cathaters are flushed each with 5 mL of Bupivicaine and stabilized into place with dressing. Instrument, sponge, and needle counts were correct prior to the abdominal closure and at the  conclusion of the case.  The patient tolerated the procedure  well and was transferred to the recovery room in stable condition.   Annamarie Major, MD, Merlinda Frederick Ob/Gyn, [facility] 10/13/2020  4:45 PM

## 2020-10-13 NOTE — Progress Notes (Signed)
Central telemetry called at 1730 to notify RN of concern for cardiac rhythm. RN at bedside to auscutate patient heart and lungs and lungs sounded clear and HR WNL. Patient denies shortness of breath/difficulty breathing. At 1737 RN paged Tammy Pea MD. Tammy Frey, MDA notified at 1745 and made aware of pt heart rhythm and he stated to notify Tammy Pea MD. Tammy Pea MD spoke to RN at 1757 and gave verbal order for patient to have a routine order for cardiology consult. Tammy Frey, cardiologist, notified at 1806. RN notified cardiologist of "sinus arrhythmia and 'one of atrial pace' per central telemetry" and consult that was put in. Cardiologist reports that he will come see patient in the morning and that we can discontinue cardiac monitoring. Tammy Pea, MD and Tammy Frey, MDA aware. Tammy Frey, MDA states he is okay with patient having telemetry discontinued.

## 2020-10-13 NOTE — Anesthesia Preprocedure Evaluation (Signed)
Anesthesia Evaluation  Patient identified by MRN, date of birth, ID band Patient awake    Reviewed: Allergy & Precautions, H&P , NPO status , Patient's Chart, lab work & pertinent test results, reviewed documented beta blocker date and time   History of Anesthesia Complications Negative for: history of anesthetic complications  Airway Mallampati: III  TM Distance: >3 FB Neck ROM: full    Dental  (+) Dental Advidsory Given, Poor Dentition, Missing   Pulmonary neg shortness of breath, neg COPD, neg recent URI, Current Smoker,    Pulmonary exam normal breath sounds clear to auscultation       Cardiovascular Exercise Tolerance: Good negative cardio ROS Normal cardiovascular exam Rhythm:regular Rate:Normal     Neuro/Psych negative neurological ROS  negative psych ROS   GI/Hepatic Neg liver ROS, GERD  ,  Endo/Other  negative endocrine ROS  Renal/GU negative Renal ROS  negative genitourinary   Musculoskeletal   Abdominal   Peds  Hematology negative hematology ROS (+)   Anesthesia Other Findings Past Medical History: No date: Anemia 09/10/2020: IDA (iron deficiency anemia) No date: Seizures (HCC)   Reproductive/Obstetrics (+) Pregnancy                             Anesthesia Physical Anesthesia Plan  ASA: II  Anesthesia Plan: Spinal   Post-op Pain Management:    Induction:   PONV Risk Score and Plan:   Airway Management Planned: Natural Airway and Nasal Cannula  Additional Equipment:   Intra-op Plan:   Post-operative Plan:   Informed Consent: I have reviewed the patients History and Physical, chart, labs and discussed the procedure including the risks, benefits and alternatives for the proposed anesthesia with the patient or authorized representative who has indicated his/her understanding and acceptance.     Dental Advisory Given  Plan Discussed with: Anesthesiologist, CRNA  and Surgeon  Anesthesia Plan Comments:         Anesthesia Quick Evaluation

## 2020-10-13 NOTE — Progress Notes (Signed)
Called to review telemetry strip which showed sinus arrhythmia with one premature atrial contraction.  No need for continued telemetry at this time.

## 2020-10-14 DIAGNOSIS — G8918 Other acute postprocedural pain: Secondary | ICD-10-CM | POA: Diagnosis not present

## 2020-10-14 DIAGNOSIS — R1084 Generalized abdominal pain: Secondary | ICD-10-CM | POA: Diagnosis not present

## 2020-10-14 LAB — RPR: RPR Ser Ql: NONREACTIVE

## 2020-10-14 LAB — CBC
HCT: 27 % — ABNORMAL LOW (ref 36.0–46.0)
Hemoglobin: 8.9 g/dL — ABNORMAL LOW (ref 12.0–15.0)
MCH: 31 pg (ref 26.0–34.0)
MCHC: 33 g/dL (ref 30.0–36.0)
MCV: 94.1 fL (ref 80.0–100.0)
Platelets: 188 10*3/uL (ref 150–400)
RBC: 2.87 MIL/uL — ABNORMAL LOW (ref 3.87–5.11)
RDW: 14.6 % (ref 11.5–15.5)
WBC: 10.6 10*3/uL — ABNORMAL HIGH (ref 4.0–10.5)
nRBC: 0 % (ref 0.0–0.2)

## 2020-10-14 MED ORDER — IBUPROFEN 600 MG PO TABS
600.0000 mg | ORAL_TABLET | Freq: Four times a day (QID) | ORAL | Status: DC
  Administered 2020-10-14 – 2020-10-15 (×4): 600 mg via ORAL
  Filled 2020-10-14 (×5): qty 1

## 2020-10-14 MED ORDER — OXYCODONE HCL 5 MG PO TABS: 5.0000 mg | ORAL_TABLET | ORAL | Status: DC | PRN

## 2020-10-14 NOTE — Anesthesia Postprocedure Evaluation (Signed)
Anesthesia Post Note  Patient: Tammy Frey  Procedure(s) Performed: CESAREAN SECTION WITH BILATERAL TUBAL LIGATION  Patient location during evaluation: Mother Baby Anesthesia Type: Spinal Level of consciousness: awake and alert Pain management: pain level controlled Vital Signs Assessment: post-procedure vital signs reviewed and stable Respiratory status: spontaneous breathing, nonlabored ventilation and respiratory function stable Cardiovascular status: stable Postop Assessment: no headache, no backache, spinal receding and patient able to bend at knees Anesthetic complications: no   No complications documented.   Last Vitals:  Vitals:   10/14/20 1124 10/14/20 1300  BP: 127/69   Pulse: 71 65  Resp: 18   Temp:    SpO2: 99% 98%    Last Pain:  Vitals:   10/14/20 0830  TempSrc:   PainSc: 3                  Corinda Gubler

## 2020-10-14 NOTE — Lactation Note (Signed)
This note was copied from a baby's chart. Lactation Consultation Note  Patient Name: Girl Halen Mossbarger [id number] Date: 10/14/2020   Age:34 hours  Maternal Data   Discussed mom's feeding choice.  Mom reports bottle feeding formula with first baby and plans to do the same with this baby.  Peighton is on Similac 22 calorie formula.  Hand out on infant formula preparation and reviewed preparation, storage, mixing powdered formula with water, warming bottles and prevention of cronobacter.  Explained how to dry up milk by wearing well fitting supportive bra, not stimulating or using warmth, but cold and cabbage leaves instead.  Reviewed differences, prevention and treatment of full breasts, engorgement, plugged ducts and mastitis and when to seek further help.  Name and number written on white board and encouraged to call with any questions, concerns or assistance. Feeding Feeding Type: Bottle Fed - Formula Nipple Type: Slow - flow  LATCH Score                   Interventions    Lactation Tools Discussed/Used     Consult Status      Louis Meckel 10/14/2020, 8:11 PM

## 2020-10-14 NOTE — Progress Notes (Signed)
Admit Date: 10/13/2020 Today's Date: 10/14/2020  Subjective: Postpartum Day 1: Cesarean Delivery Patient reports tolerating PO, + flatus and no problems voiding.    Objective: Vital signs in last 24 hours: Temp:  [97.2 F (36.2 C)-98.4 F (36.9 C)] 98.2 F (36.8 C) (01/09 0758) Pulse Rate:  [49-121] 71 (01/09 1124) Resp:  [10-23] 18 (01/09 1124) BP: (112-157)/(50-80) 127/69 (01/09 1124) SpO2:  [95 %-100 %] 99 % (01/09 1124)  Physical Exam:  General: alert, cooperative and no distress Lochia: appropriate Uterine Fundus: firm Incision: healing well, no significant drainage, no dehiscence, no significant erythema DVT Evaluation: No evidence of DVT seen on physical exam. Negative Homan's sign. No cords or calf tenderness. No significant calf/ankle edema.  Recent Labs    10/13/20 0911 10/14/20 0623  HGB 9.9* 8.9*  HCT 30.0* 27.0*    Assessment/Plan: Status post Cesarean section. Doing well postoperatively.  Continue current care. S/p BTL as well Anemia, no sx's.  Monitor.  Iron po therapy. Covid vaccine desired, first shot prior to discharge  Tammy Frey 10/14/2020, 12:45 PM

## 2020-10-14 NOTE — Anesthesia Post-op Follow-up Note (Signed)
  Anesthesia Pain Follow-up Note  Patient: Tammy Frey  Day #: 1  Date of Follow-up: 10/14/2020 Time: 2:56 PM  Last Vitals:  Vitals:   10/14/20 1124 10/14/20 1300  BP: 127/69   Pulse: 71 65  Resp: 18   Temp:    SpO2: 99% 98%    Level of Consciousness: alert  Pain: mild   Side Effects:None  Catheter Site Exam:clean, dry     Plan: D/C from anesthesia care at surgeon's request  Corinda Gubler

## 2020-10-15 ENCOUNTER — Encounter: Payer: Self-pay | Admitting: Obstetrics & Gynecology

## 2020-10-15 MED ORDER — IBUPROFEN 600 MG PO TABS: 600.0000 mg | ORAL_TABLET | Freq: Four times a day (QID) | ORAL | 1 refills | Status: DC

## 2020-10-15 MED ORDER — ACETAMINOPHEN 325 MG PO TABS: 650.0000 mg | ORAL_TABLET | ORAL | 2 refills | Status: AC | PRN

## 2020-10-15 MED ORDER — SIMETHICONE 80 MG PO CHEW: 80.0000 mg | CHEWABLE_TABLET | ORAL | 1 refills | Status: DC | PRN

## 2020-10-15 NOTE — Plan of Care (Signed)
Patient progressing well postpartum. Pain being managed and patient reports that she feels good and is ready to go home. Education provided to mother and support person and social work evaluated patient prior to discharge for Edinburgh Score.

## 2020-10-15 NOTE — TOC Initial Note (Addendum)
Transition of Care [facility]) - Initial/Assessment Note    Patient Details  Name: Tammy Frey MRN: [id number] Date of Birth: 04/27/1986  Transition of Care [facility]) CM/SW Contact:    Laurens Cellar, RN Phone Number: 10/15/2020, 9:31 AM  Clinical Narrative:                 Spoke with patient and discussed Edinburgh scores. Patient states she did not put much thought into test and just wanted to be done with it. Patient reports she did experience some baby blues after her first pregnancy and is aware of s/s of PPD. Reports no history of anxiety or depression and no concerns related to such.  Patient lives with her 12 year old daughter and her parents. Strong support system available. Infant will be active with [facility]. Plan is for bottlefeeding only. All equipment has been purchased and no needs currently. Patient did express some concerns regarding transportation. RN CM discussed availability of assistance via Medicaid-DSS. Patient already set up with [facility] services and had questions regarding specific formula as infant is on high calorie formula in hospital. Discussed importance of speaking with pediatrician and [facility] nurse about specific needs as it relates to formula. WIC nurse will base on weight and need and communicate with pediatrician if speciality formula needed. Patient with no other needs or concerns at this time. Transportation set up for discharge home today with family.         Patient Goals and CMS Choice        Expected Discharge Plan and Services           Expected Discharge Date: 10/15/20                                    Prior Living Arrangements/Services                       Activities of Daily Living Home Assistive Devices/Equipment: None ADL Screening (condition at time of admission) Patient's cognitive ability adequate to safely complete daily activities?: Yes Is the patient deaf or have difficulty hearing?: No Does the patient have  difficulty seeing, even when wearing glasses/contacts?: No Does the patient have difficulty concentrating, remembering, or making decisions?: No Patient able to express need for assistance with ADLs?: Yes Does the patient have difficulty dressing or bathing?: No Independently performs ADLs?: Yes (appropriate for developmental age) Does the patient have difficulty walking or climbing stairs?: No Weakness of Legs: None Weakness of Arms/Hands: None  Permission Sought/Granted                  Emotional Assessment              Admission diagnosis:  Pregnancy [Z34.90] Patient Active Problem List   Diagnosis Date Noted  . Cesarean delivery, delivered, current hospitalization 10/15/2020  . Pregnancy 10/13/2020  . Fetal intolerance to labor, delivered, current hospitalization 10/13/2020  . Admission for sterilization   . IDA (iron deficiency anemia) 09/10/2020  . Preterm contractions 09/04/2020  . Uterine contractions 09/01/2020  . Preterm uterine contractions in third trimester, antepartum   . Supervision of other high risk pregnancies, third trimester 03/09/2020   PCP:  Natale Milch, MD Pharmacy:   [facility] [address] [place] - [address] [place] [address] Phone: [phone] Fax: [phone]  [facility] - [place], [place] -  [address] [place] [address] Phone: [phone] Fax: [phone]     Social Determinants of Health (SDOH) Interventions    Readmission Risk Interventions No flowsheet data found.

## 2020-10-16 LAB — SURGICAL PATHOLOGY

## 2020-10-18 ENCOUNTER — Telehealth: Payer: Self-pay

## 2020-10-18 NOTE — Telephone Encounter (Signed)
Patient inquiring if it is ok for her to remove her tubes today. [phone]

## 2020-10-18 NOTE — Telephone Encounter (Signed)
Pt aware. Ok to remove on day 4 s/p c-section per Dr. Jean Rosenthal

## 2020-10-22 ENCOUNTER — Encounter: Payer: Medicaid Other | Admitting: Obstetrics & Gynecology

## 2020-10-23 ENCOUNTER — Other Ambulatory Visit: Payer: Self-pay

## 2020-10-23 ENCOUNTER — Encounter: Payer: Self-pay | Admitting: Obstetrics & Gynecology

## 2020-10-23 ENCOUNTER — Ambulatory Visit (INDEPENDENT_AMBULATORY_CARE_PROVIDER_SITE_OTHER): Payer: Medicaid Other | Admitting: Obstetrics & Gynecology

## 2020-10-23 VITALS — BP 138/80 | Wt 172.0 lb

## 2020-10-23 DIAGNOSIS — Z9851 Tubal ligation status: Secondary | ICD-10-CM | POA: Insufficient documentation

## 2020-10-23 DIAGNOSIS — Z98891 History of uterine scar from previous surgery: Secondary | ICD-10-CM

## 2020-10-23 NOTE — Progress Notes (Signed)
  Postoperative Follow-up Patient presents post op from recent Cesarean Section w BTL performed for Non-reassuring FHR, 1 week ago.  Subjective: Patient reports marked improvement in her immediate post op symptoms. Eating a regular diet without difficulty. Pain is controlled with current analgesics. Medications being used: ibuprofen (OTC).  Activity: sedentary. Patient reports additional symptom's since surgery of appropriate lochia, no signs of depression, and no signs of mastitis.  Objective: BP 138/80   Wt 172 lb (78 kg)   BMI 25.40 kg/m  Physical Exam Constitutional:      General: She is not in acute distress.    Appearance: She is well-developed and well-nourished.  Cardiovascular:     Rate and Rhythm: Normal rate.  Pulmonary:     Effort: Pulmonary effort is normal.  Abdominal:     General: There is no distension.     Palpations: Abdomen is soft.     Tenderness: There is no abdominal tenderness.     Comments: [facility] He[facility]   Musculoskeletal:        General: Normal range of motion.  Neurological:     Mental Status: She is alert and oriented to person, place, and time.     Cranial Nerves: No cranial nerve deficit.  Skin:    General: Skin is warm and dry.  Psychiatric:        Mood and Affect: Mood and affect normal.     Assessment: s/p : Cesarean Section for Non-reassuring FHR stable  Plan: Patient has done well after her Cesarean Section with no apparent complications.  I have discussed the post-operative course to date, and the expected progress moving forward.  The patient understands what complications to be concerned about.  I will see the patient in routine follow up, or sooner if needed.    Activity plan: No heavy lifting.Marland Kitchen  Pelvic rest. She is s/p BTL for postpartum contraception. Bottle feeding  H/ dymenorrhea and menorrhagia, responded well to Mirena in past.  Discussed future options for tx (Mirena, Ablation, other) based on future period  pattern  Letitia Libra 10/23/2020, 4:28 PM

## 2020-10-23 NOTE — Patient Instructions (Signed)
Thank you for choosing [facility]. As part of our ongoing efforts to improve patient experience, we would appreciate your feedback. Please fill out the short survey that you will receive by mail or MyChart. Your opinion is important to us! -Dr Harris  

## 2020-10-24 ENCOUNTER — Inpatient Hospital Stay: Payer: Medicaid Other | Attending: Oncology

## 2020-10-24 DIAGNOSIS — F17210 Nicotine dependence, cigarettes, uncomplicated: Secondary | ICD-10-CM | POA: Diagnosis not present

## 2020-10-24 DIAGNOSIS — Z79899 Other long term (current) drug therapy: Secondary | ICD-10-CM | POA: Insufficient documentation

## 2020-10-24 DIAGNOSIS — R110 Nausea: Secondary | ICD-10-CM | POA: Insufficient documentation

## 2020-10-24 DIAGNOSIS — R5383 Other fatigue: Secondary | ICD-10-CM | POA: Insufficient documentation

## 2020-10-24 DIAGNOSIS — Z801 Family history of malignant neoplasm of trachea, bronchus and lung: Secondary | ICD-10-CM | POA: Diagnosis not present

## 2020-10-24 DIAGNOSIS — Z803 Family history of malignant neoplasm of breast: Secondary | ICD-10-CM | POA: Insufficient documentation

## 2020-10-24 DIAGNOSIS — D509 Iron deficiency anemia, unspecified: Secondary | ICD-10-CM | POA: Diagnosis not present

## 2020-10-24 DIAGNOSIS — Z8669 Personal history of other diseases of the nervous system and sense organs: Secondary | ICD-10-CM | POA: Insufficient documentation

## 2020-10-24 DIAGNOSIS — Z800 Family history of malignant neoplasm of digestive organs: Secondary | ICD-10-CM | POA: Insufficient documentation

## 2020-10-24 DIAGNOSIS — Z331 Pregnant state, incidental: Secondary | ICD-10-CM | POA: Insufficient documentation

## 2020-10-24 DIAGNOSIS — D508 Other iron deficiency anemias: Secondary | ICD-10-CM

## 2020-10-24 LAB — CBC WITH DIFFERENTIAL/PLATELET
Abs Immature Granulocytes: 0.02 10*3/uL (ref 0.00–0.07)
Basophils Absolute: 0 10*3/uL (ref 0.0–0.1)
Basophils Relative: 0 %
Eosinophils Absolute: 0.1 10*3/uL (ref 0.0–0.5)
Eosinophils Relative: 2 %
HCT: 31.2 % — ABNORMAL LOW (ref 36.0–46.0)
Hemoglobin: 10.4 g/dL — ABNORMAL LOW (ref 12.0–15.0)
Immature Granulocytes: 0 %
Lymphocytes Relative: 30 %
Lymphs Abs: 2 10*3/uL (ref 0.7–4.0)
MCH: 31.1 pg (ref 26.0–34.0)
MCHC: 33.3 g/dL (ref 30.0–36.0)
MCV: 93.4 fL (ref 80.0–100.0)
Monocytes Absolute: 0.5 10*3/uL (ref 0.1–1.0)
Monocytes Relative: 8 %
Neutro Abs: 4.1 10*3/uL (ref 1.7–7.7)
Neutrophils Relative %: 60 %
Platelets: 305 10*3/uL (ref 150–400)
RBC: 3.34 MIL/uL — ABNORMAL LOW (ref 3.87–5.11)
RDW: 14.6 % (ref 11.5–15.5)
WBC: 6.9 10*3/uL (ref 4.0–10.5)
nRBC: 0 % (ref 0.0–0.2)

## 2020-10-24 LAB — IRON AND TIBC
Iron: 55 ug/dL (ref 28–170)
Saturation Ratios: 15 % (ref 10.4–31.8)
TIBC: 364 ug/dL (ref 250–450)
UIBC: 309 ug/dL

## 2020-10-24 LAB — FERRITIN: Ferritin: 98 ng/mL (ref 11–307)

## 2020-10-26 ENCOUNTER — Inpatient Hospital Stay: Payer: Medicaid Other

## 2020-10-26 ENCOUNTER — Inpatient Hospital Stay (HOSPITAL_BASED_OUTPATIENT_CLINIC_OR_DEPARTMENT_OTHER): Payer: Medicaid Other | Admitting: Oncology

## 2020-10-26 ENCOUNTER — Encounter: Payer: Self-pay | Admitting: Oncology

## 2020-10-26 VITALS — BP 162/81 | HR 59 | Temp 98.4°F | Resp 18 | Wt 165.2 lb

## 2020-10-26 VITALS — BP 147/77 | HR 50 | Temp 97.0°F | Resp 18

## 2020-10-26 DIAGNOSIS — D508 Other iron deficiency anemias: Secondary | ICD-10-CM

## 2020-10-26 DIAGNOSIS — F17210 Nicotine dependence, cigarettes, uncomplicated: Secondary | ICD-10-CM | POA: Diagnosis not present

## 2020-10-26 DIAGNOSIS — Z8669 Personal history of other diseases of the nervous system and sense organs: Secondary | ICD-10-CM | POA: Diagnosis not present

## 2020-10-26 DIAGNOSIS — Z331 Pregnant state, incidental: Secondary | ICD-10-CM | POA: Diagnosis not present

## 2020-10-26 DIAGNOSIS — R5383 Other fatigue: Secondary | ICD-10-CM | POA: Diagnosis not present

## 2020-10-26 DIAGNOSIS — D509 Iron deficiency anemia, unspecified: Secondary | ICD-10-CM | POA: Diagnosis not present

## 2020-10-26 DIAGNOSIS — R110 Nausea: Secondary | ICD-10-CM | POA: Diagnosis not present

## 2020-10-26 DIAGNOSIS — Z79899 Other long term (current) drug therapy: Secondary | ICD-10-CM | POA: Diagnosis not present

## 2020-10-26 DIAGNOSIS — Z800 Family history of malignant neoplasm of digestive organs: Secondary | ICD-10-CM | POA: Diagnosis not present

## 2020-10-26 DIAGNOSIS — Z803 Family history of malignant neoplasm of breast: Secondary | ICD-10-CM | POA: Diagnosis not present

## 2020-10-26 DIAGNOSIS — Z801 Family history of malignant neoplasm of trachea, bronchus and lung: Secondary | ICD-10-CM | POA: Diagnosis not present

## 2020-10-26 MED ORDER — SODIUM CHLORIDE 0.9 % IV SOLN: 200.0000 mg | Freq: Once | INTRAVENOUS | Status: DC

## 2020-10-26 MED ORDER — SODIUM CHLORIDE 0.9 % IV SOLN
Freq: Once | INTRAVENOUS | Status: AC
  Filled 2020-10-26: qty 250

## 2020-10-26 MED ORDER — IRON SUCROSE 20 MG/ML IV SOLN
200.0000 mg | Freq: Once | INTRAVENOUS | Status: AC
  Administered 2020-10-26: 200 mg via INTRAVENOUS
  Filled 2020-10-26: qty 10

## 2020-10-26 NOTE — Progress Notes (Signed)
Hematology/Oncology follow up  note [facility] Telephone:([phone] Fax:([phone]   Patient Care Team: Natale Milch, MD as PCP - General (Obstetrics and Gynecology) Rickard Patience, MD as Consulting Physician (Hematology and Oncology)  REFERRING PROVIDER: Natale Milch, *  CHIEF COMPLAINTS/REASON FOR VISIT:  Follow up for anemia  HISTORY OF PRESENTING ILLNESS:   Tammy Frey is a  35 y.o.  female with PMH listed below was seen in consultation at the request of  Tammy Frey, Tammy Frey, *  for evaluation of anemia during pregnancy Patient is currently in third trimester pregnancy. She reports feeling nauseated and fatigued. 07/26/2020, hemoglobin 8.9, MCV 91. Vitamin B12 580, iron saturation 16, TIBC 446, ferritin 46 Reviewed patient's previous labs. Anemia is new onset since June 2021 when she had a hemoglobin of 10.1.  Patient had a normal hemoglobin in 2020 She reports a family history of anemia.  Denies any personal history of hemoglobinopathy or any family history of hemoglobinopathy. 04/05/2020, patient's hemoglobinopathy evaluation showed increased hgb F, which may indicate the presence of a hereditary persistence gene,or due to elevated due to pregnancy, porphyria, some malignancies. Patient denies any rectal vaginal bleeding.  #INTERVAL HISTORY Tammy Frey is a 35 y.o. female who has above history reviewed by me today presents for follow up visit for management of iron deficiency anemia  S/p IV venofer treatments previously she tolerated well. Fatigue has improved.  She delivered her baby in early January 2022.  Reports feeling well today, still quite tired.  Bottle feeding.   Review of Systems  Constitutional: Positive for fatigue. Negative for appetite change, chills and fever.  HENT:   Negative for hearing loss and voice change.   Eyes: Negative for eye problems.  Respiratory: Negative for chest tightness and cough.    Cardiovascular: Negative for chest pain.  Gastrointestinal: Negative for abdominal distention, abdominal pain, blood in stool and nausea.  Endocrine: Negative for hot flashes.  Genitourinary: Negative for difficulty urinating and frequency.   Musculoskeletal: Negative for arthralgias.  Skin: Negative for itching and rash.  Neurological: Negative for extremity weakness.  Hematological: Negative for adenopathy.  Psychiatric/Behavioral: Negative for confusion.    MEDICAL HISTORY:  Past Medical History:  Diagnosis Date  . Anemia   . IDA (iron deficiency anemia) 09/10/2020  . Seizures (HCC)     SURGICAL HISTORY: Past Surgical History:  Procedure Laterality Date  . CESAREAN SECTION WITH BILATERAL TUBAL LIGATION  10/13/2020   Procedure: CESAREAN SECTION WITH BILATERAL TUBAL LIGATION;  Surgeon: Nadara Mustard, MD;  Location: [facility] ORS;  Service: Obstetrics;;    SOCIAL HISTORY: Social History   Socioeconomic History  . Marital status: Single    Spouse name: Not on file  . Number of children: Not on file  . Years of education: Not on file  . Highest education level: Not on file  Occupational History  . Occupation: Film/video editor [employer]   Tobacco Use  . Smoking status: Current Every Day Smoker    Years: 20.00  . Smokeless tobacco: Never Used  . Tobacco comment: 3 cigarettes all day   Vaping Use  . Vaping Use: Former  . Devices: used vaping to quit smoking but did not work   Substance and Sexual Activity  . Alcohol use: Not Currently    Comment: occasionally- before preganancy   . Drug use: Yes    Types: Marijuana    Comment: last time she smoked was the week of 08/06/20  . Sexual activity:  Yes    Birth control/protection: None  Other Topics Concern  . Not on file  Social History Narrative  . Not on file   Social Determinants of Health   Financial Resource Strain: Not on file  Food Insecurity: Not on file  Transportation Needs: Not on file  Physical Activity: Not on  file  Stress: Not on file  Social Connections: Not on file  Intimate Partner Violence: Not on file    FAMILY HISTORY: Family History  Problem Relation Age of Onset  . Diabetes Father   . Breast cancer Paternal Aunt   . Lung cancer Paternal Uncle   . Cancer Paternal Grandfather     ALLERGIES:  is allergic to haldol [haloperidol].  MEDICATIONS:  Current Outpatient Medications  Medication Sig Dispense Refill  . acetaminophen (TYLENOL) 325 MG tablet Take 2 tablets (650 mg total) by mouth every 4 (four) hours as needed for mild pain (temperature > 101.5.). 30 tablet 2  . docusate sodium (COLACE) 100 MG capsule Take 100 mg by mouth daily.    . famotidine (PEPCID) 20 MG tablet Take 20 mg by mouth 2 (two) times daily as needed for heartburn or indigestion.    . ferrous sulfate 325 (65 FE) MG EC tablet Take 325 mg by mouth daily with breakfast.    . HYDROcodone-acetaminophen (NORCO) 5-325 MG tablet Take 1 tablet by mouth every 6 (six) hours as needed for moderate pain. 20 tablet 0  . ibuprofen (ADVIL) 600 MG tablet Take 1 tablet (600 mg total) by mouth every 6 (six) hours. 30 tablet 1  . ondansetron (ZOFRAN ODT) 4 MG disintegrating tablet Take 1 tablet (4 mg total) by mouth every 6 (six) hours as needed for nausea. 20 tablet 0  . promethazine (PHENERGAN) 25 MG tablet Take 1 tablet (25 mg total) by mouth every 6 (six) hours as needed for nausea or vomiting. 30 tablet 2  . simethicone (MYLICON) 80 MG chewable tablet Chew 1 tablet (80 mg total) by mouth as needed for flatulence. 30 tablet 1  . Prenatal MV & Min w/FA-DHA (ONE A DAY PRENATAL PO) Take by mouth. (Patient not taking: Reported on 10/26/2020)     No current facility-administered medications for this visit.     PHYSICAL EXAMINATION: ECOG PERFORMANCE STATUS: 0 - Asymptomatic Vitals:   10/26/20 1406  BP: (!) 162/81  Pulse: (!) 59  Resp: 18  Temp: 98.4 F (36.9 C)   Filed Weights   10/26/20 1406  Weight: 165 lb 3.2 oz (74.9  kg)    Physical Exam Constitutional:      General: She is not in acute distress. HENT:     Head: Normocephalic and atraumatic.  Eyes:     General: No scleral icterus. Cardiovascular:     Rate and Rhythm: Normal rate and regular rhythm.     Heart sounds: Normal heart sounds.  Pulmonary:     Effort: Pulmonary effort is normal. No respiratory distress.     Breath sounds: No wheezing.  Abdominal:     General: Bowel sounds are normal. There is no distension.     Palpations: Abdomen is soft.  Musculoskeletal:     Cervical back: Normal range of motion and neck supple.     Right lower leg: No edema.     Left lower leg: No edema.  Skin:    General: Skin is warm and dry.     Findings: No erythema or rash.  Neurological:     Mental Status: She is  alert and oriented to person, place, and time. Mental status is at baseline.     Cranial Nerves: No cranial nerve deficit.     Coordination: Coordination normal.  Psychiatric:        Mood and Affect: Mood normal.     LABORATORY DATA:  I have reviewed the data as listed Lab Results  Component Value Date   WBC 6.9 10/24/2020   HGB 10.4 (L) 10/24/2020   HCT 31.2 (L) 10/24/2020   MCV 93.4 10/24/2020   PLT 305 10/24/2020   Recent Labs    03/06/20 0955 06/04/20 1115 08/16/20 1023  NA 134* 136 134*  K 3.4* 3.7 3.6  CL 104 107 104  CO2 23 22 22   GLUCOSE 101* 87 97  BUN 10 8 11   CREATININE 0.70 0.46 0.42*  CALCIUM 8.7* 8.3* 8.8*  GFRNONAA >60 >60 >60  GFRAA >60 >60  --   PROT  --  6.8 6.9  ALBUMIN  --  3.6 3.4*  AST  --  39 17  ALT  --  57* 17  ALKPHOS  --  36* 70  BILITOT  --  0.4 0.3   Iron/TIBC/Ferritin/ %Sat    Component Value Date/Time   IRON 55 10/24/2020 1045   IRON 73 08/10/2020 1219   TIBC 364 10/24/2020 1045   TIBC 446 08/10/2020 1219   FERRITIN 98 10/24/2020 1045   FERRITIN 46 08/10/2020 1219   IRONPCTSAT 15 10/24/2020 1045   IRONPCTSAT 16 08/10/2020 1219      RADIOGRAPHIC STUDIES: I have personally  reviewed the radiological images as listed and agreed with the findings in the report. No results found.    ASSESSMENT & PLAN:  1. Other iron deficiency anemia    Labs are reviewed and discussed with patient. Hemoglobin has improved to 10.4, iron panel has also improved.  Proceed with IV venofer 200mg  x 1 today. Recommend patient to continue take oral iron supplementation once daily for maintenance.   She will follow-up with me in 6 to 8 weeks for evaluation of need of additional IV Venofer treatments. Follow up in 6 months.   All questions were answered. The patient knows to call the clinic with any problems questions or concerns.  cc Schuman, 10/26/2020, *    13/02/2020, MD, PhD Hematology Oncology [facility] Pager- Rickard Patience 10/26/2020

## 2020-10-26 NOTE — Progress Notes (Signed)
Patient denies new problems/concerns today.   °

## 2020-10-26 NOTE — Progress Notes (Signed)
Pt tolerated infusion well. No s/s of distress or reaction noted. Pt and VS stable at discharge.

## 2020-11-06 DIAGNOSIS — Z419 Encounter for procedure for purposes other than remedying health state, unspecified: Secondary | ICD-10-CM | POA: Diagnosis not present

## 2020-11-27 ENCOUNTER — Other Ambulatory Visit: Payer: Self-pay

## 2020-11-27 ENCOUNTER — Encounter: Payer: Self-pay | Admitting: Obstetrics & Gynecology

## 2020-11-27 ENCOUNTER — Ambulatory Visit (INDEPENDENT_AMBULATORY_CARE_PROVIDER_SITE_OTHER): Payer: Medicaid Other | Admitting: Obstetrics & Gynecology

## 2020-11-27 NOTE — Progress Notes (Signed)
  OBSTETRICS POSTPARTUM CLINIC PROGRESS NOTE  Subjective:     Tammy Frey is a 35 y.o. [id number] female who presents for a postpartum visit. She is 6 weeks postpartum following a Term pregnancy and delivery by C-section repeat; no problems after deliver.  I have fully reviewed the prenatal and intrapartum course. Anesthesia: epidural.  Postpartum course has been complicated by uncomplicated.  Baby is feeding by Bottle.  Bleeding: patient has  resumed menses.  Bowel function is normal. Bladder function is normal.  Patient is not sexually active. Contraception method desired is tubal ligation.  Postpartum depression screening: negative. Edinburgh 3.  The following portions of the patient's history were reviewed and updated as appropriate: allergies, current medications, past family history, past medical history, past social history, past surgical history and problem list.  Review of Systems Pertinent items are noted in HPI.  Objective:    BP 102/70   Ht 5\' 7"  (1.702 m)   Wt 163 lb (73.9 kg)   BMI 25.53 kg/m   General:  alert and no distress   Breasts:  inspection negative, no nipple discharge or bleeding, no masses or nodularity palpable  Lungs: clear to auscultation bilaterally  Heart:  regular rate and rhythm, S1, S2 normal, no murmur, click, rub or gallop  Abdomen: soft, non-tender; bowel sounds normal; no masses,  no organomegaly.  Well healed Pfannenstiel incision   Vulva:  normal  Vagina: normal vagina, no discharge, exudate, lesion, or erythema  Cervix:  no cervical motion tenderness and no lesions  Corpus: normal size, contour, position, consistency, mobility, non-tender  Adnexa:  normal adnexa and no mass, fullness, tenderness  Rectal Exam: Not performed.          Assessment:  Post Partum Care visit 1. Postpartum care and examination   Plan:  See orders and Patient Instructions Resume all normal activities Follow up in: 6 months or as needed.    PAP in  Summer 2022  H/O dymenorrhea and menorrhagia, responded well to Mirena in past.  Discussed future options for tx (Mirena, Ablation, other) based on future period pattern  09[birth year]1978, MD, [facility], [facility] 11/27/2020  10:04 AM

## 2020-11-27 NOTE — Patient Instructions (Signed)
Thank you for choosing [facility]. As part of our ongoing efforts to improve patient experience, we would appreciate your feedback. Please fill out the short survey that you will receive by mail or MyChart. Your opinion is important to us! -Dr Erikah Thumm  

## 2020-12-04 DIAGNOSIS — Z419 Encounter for procedure for purposes other than remedying health state, unspecified: Secondary | ICD-10-CM | POA: Diagnosis not present

## 2021-01-04 DIAGNOSIS — Z419 Encounter for procedure for purposes other than remedying health state, unspecified: Secondary | ICD-10-CM | POA: Diagnosis not present

## 2021-02-03 DIAGNOSIS — Z419 Encounter for procedure for purposes other than remedying health state, unspecified: Secondary | ICD-10-CM | POA: Diagnosis not present

## 2021-03-06 DIAGNOSIS — Z419 Encounter for procedure for purposes other than remedying health state, unspecified: Secondary | ICD-10-CM | POA: Diagnosis not present

## 2021-04-05 DIAGNOSIS — Z419 Encounter for procedure for purposes other than remedying health state, unspecified: Secondary | ICD-10-CM | POA: Diagnosis not present

## 2021-04-24 ENCOUNTER — Other Ambulatory Visit: Payer: Self-pay

## 2021-04-24 DIAGNOSIS — D508 Other iron deficiency anemias: Secondary | ICD-10-CM

## 2021-04-26 ENCOUNTER — Inpatient Hospital Stay: Payer: Medicaid Other | Attending: Oncology

## 2021-04-29 ENCOUNTER — Telehealth: Payer: Self-pay

## 2021-04-29 ENCOUNTER — Inpatient Hospital Stay: Payer: Medicaid Other | Admitting: Oncology

## 2021-04-29 ENCOUNTER — Other Ambulatory Visit: Payer: Self-pay

## 2021-04-29 NOTE — Telephone Encounter (Signed)
Left voicemail for pt to call clinic and reschedule appts.

## 2021-04-29 NOTE — Telephone Encounter (Signed)
patient no showed to labs on 7/22 and is scheduled for Mychart visit today. Please cancel appt and r/s, per pt availability: labs , then Mychart 1-2 days after labs. thanks

## 2021-05-01 ENCOUNTER — Encounter: Payer: Self-pay | Admitting: Oncology

## 2021-05-06 DIAGNOSIS — Z419 Encounter for procedure for purposes other than remedying health state, unspecified: Secondary | ICD-10-CM | POA: Diagnosis not present

## 2021-06-06 DIAGNOSIS — Z419 Encounter for procedure for purposes other than remedying health state, unspecified: Secondary | ICD-10-CM | POA: Diagnosis not present

## 2021-07-06 DIAGNOSIS — Z419 Encounter for procedure for purposes other than remedying health state, unspecified: Secondary | ICD-10-CM | POA: Diagnosis not present

## 2021-07-21 IMAGING — US US OB < 14 WEEKS - US OB TV
1 series · 14 of 28 positions shown · non-contrast
Comparison: None.

CLINICAL DATA: Vaginal bleeding, positive beta HCG

EXAM:
OBSTETRIC <14 WK US AND TRANSVAGINAL OB US
TECHNIQUE: Both transabdominal and transvaginal ultrasound examinations were
performed for complete evaluation of the gestation as well as the
maternal uterus, adnexal regions, and pelvic cul-de-sac.
Transvaginal technique was performed to assess early pregnancy.

[Series 1: us ob less than 14 weeks with ob transvaginal · 14 of 35 slices shown]
[im 2/35]
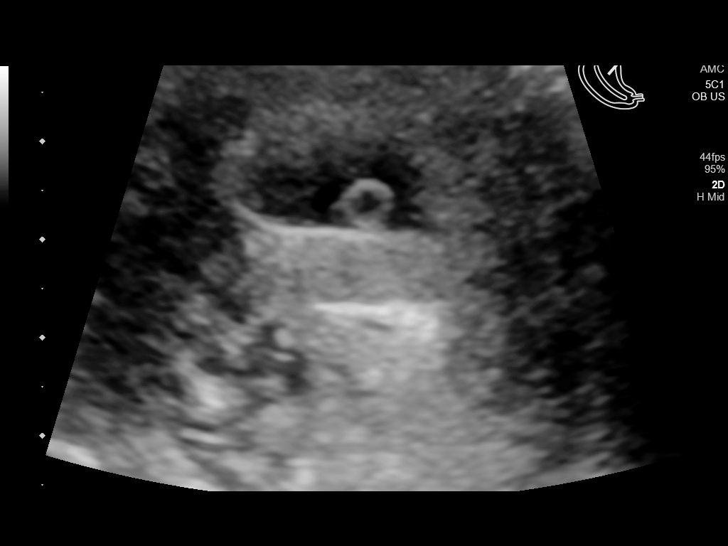
[im 4/35]
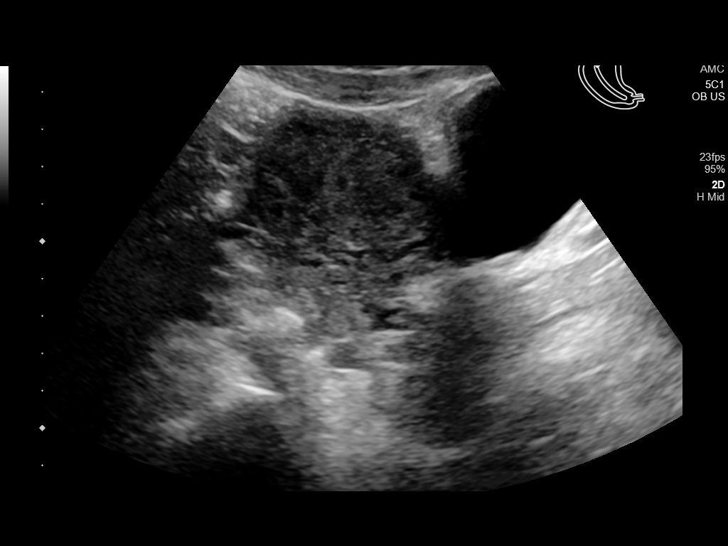
[im 7/35]
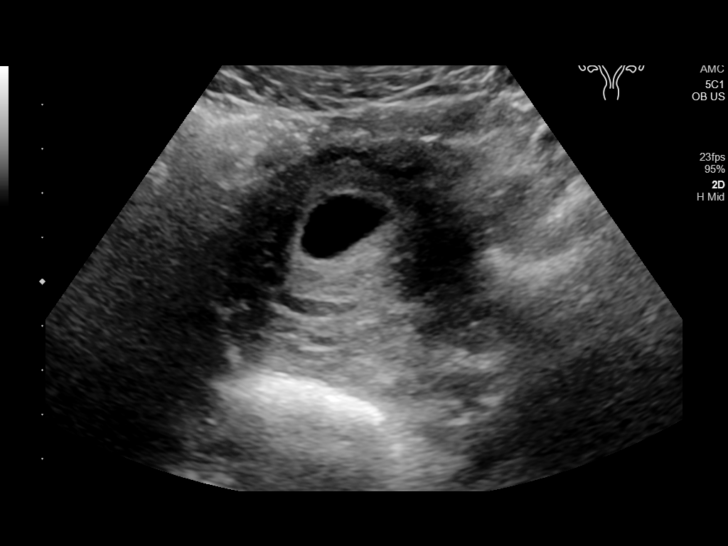
[im 9/35]
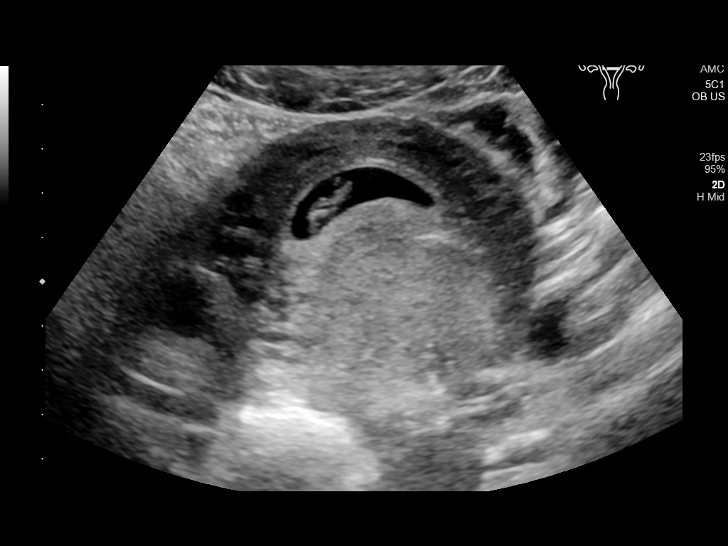
[im 12/35]
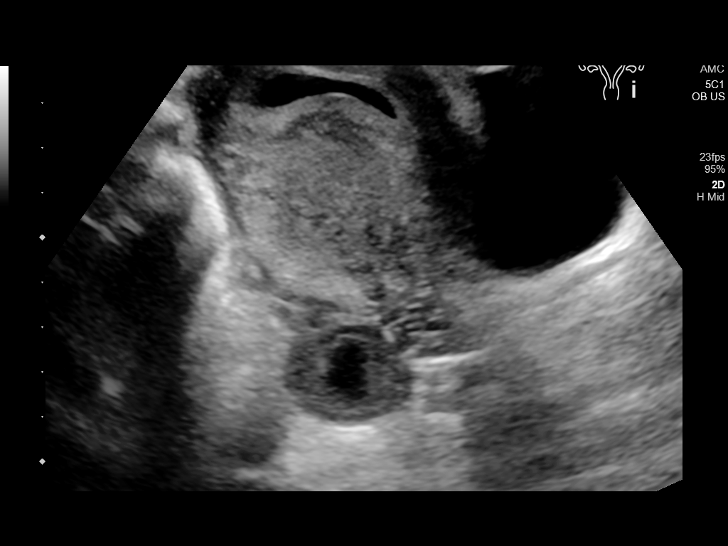
[im 14/35]
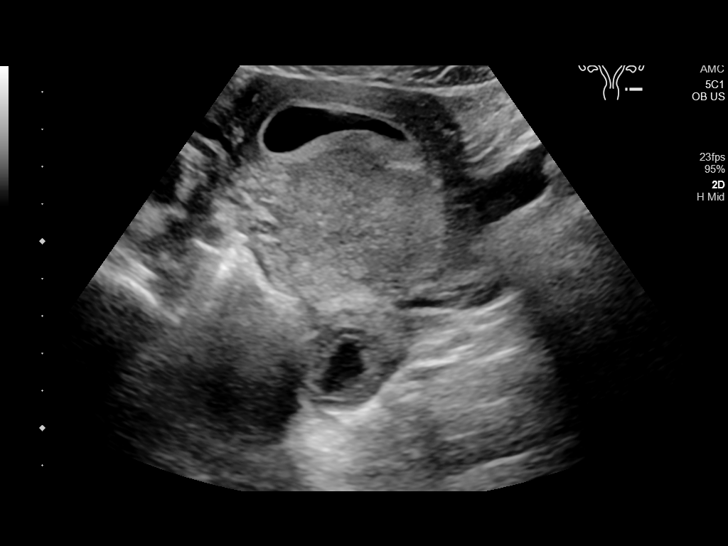
[im 17/35]
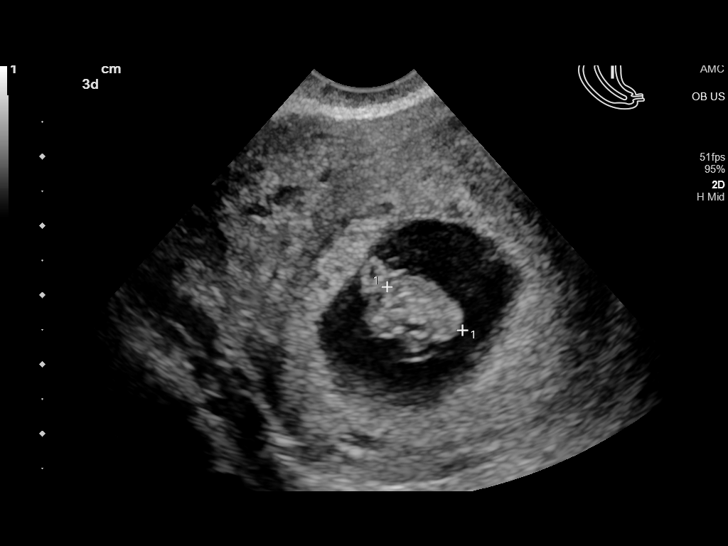
[im 19/35]
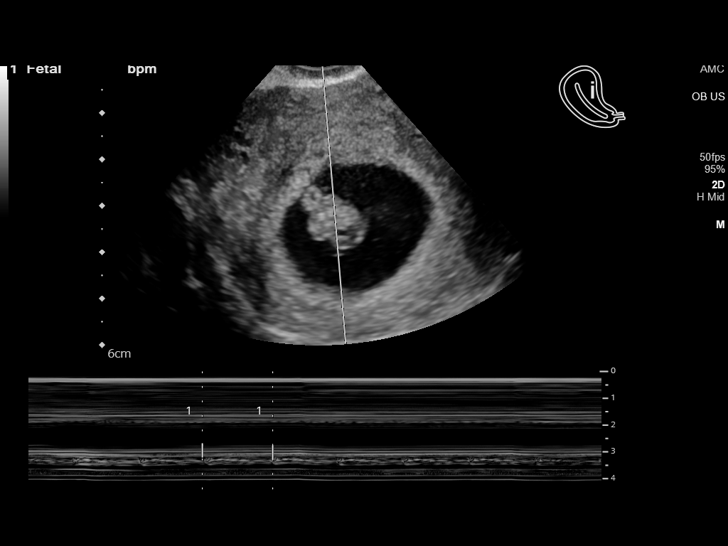
[im 22/35]
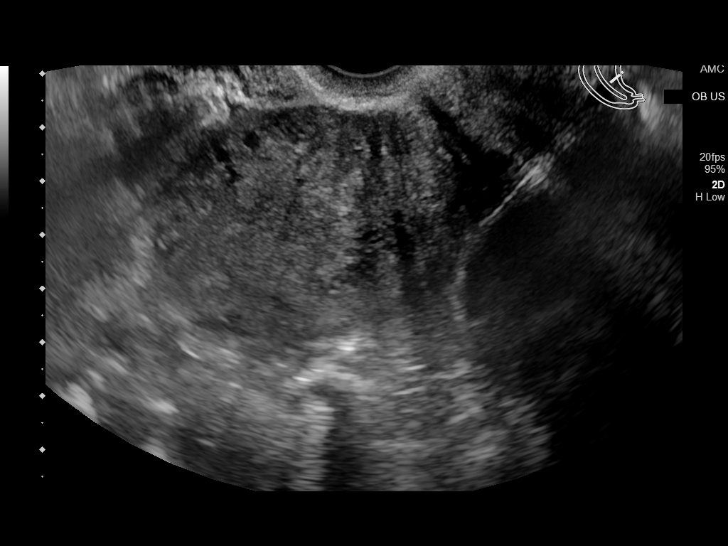
[im 24/35]
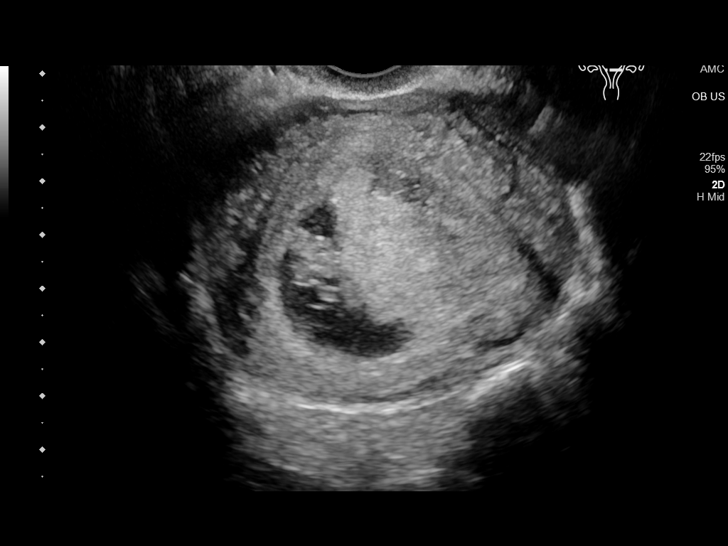
[im 27/35]
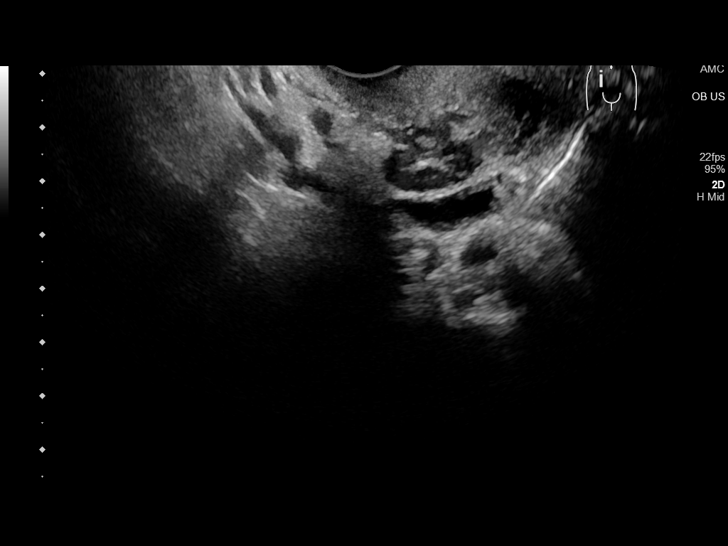
[im 29/35]
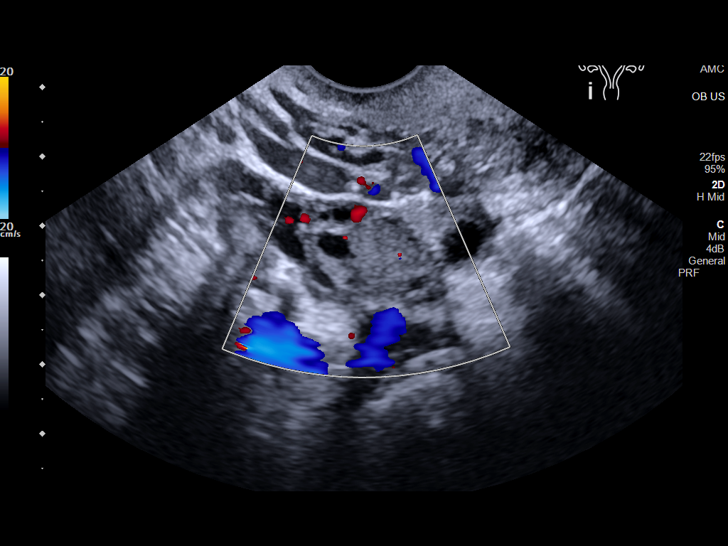
[im 32/35]
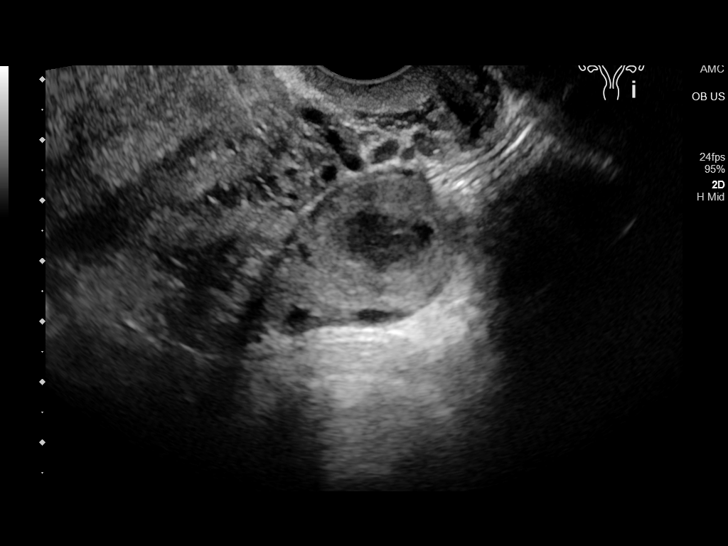
[im 35/35]
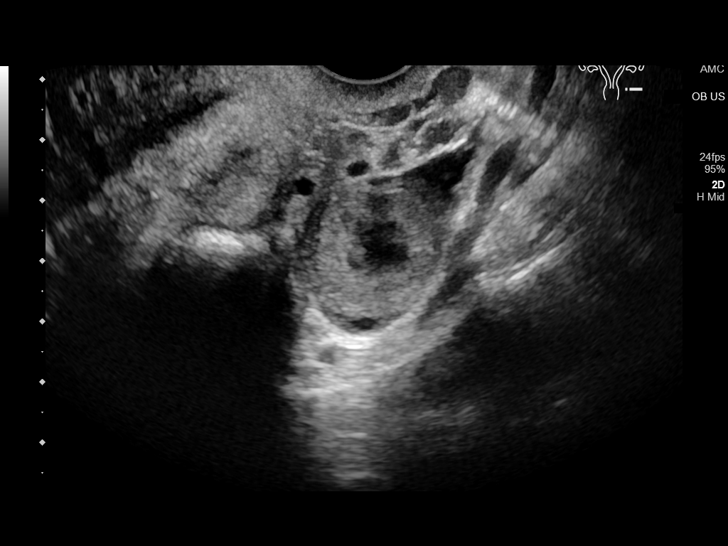

[14 of 28 positions shown; findings below may reference images not displayed]

FINDINGS: Intrauterine gestational sac: Single

Yolk sac:  Visualized.

Embryo:  Visualized.

Cardiac Activity: Visualized.

Heart Rate: 145 bpm

CRL:  12.5 mm   7 w   3 d                  US EDC: 10/20/2020

Subchorionic hemorrhage:  None visualized.

Maternal uterus/adnexae: Left ovarian corpus luteal cyst.
Unremarkable right ovary. Trace fluid within the pelvis.
IMPRESSION: Single live intrauterine gestation measuring 7 weeks 3 days by
crown-rump length.

## 2021-08-06 DIAGNOSIS — Z419 Encounter for procedure for purposes other than remedying health state, unspecified: Secondary | ICD-10-CM | POA: Diagnosis not present

## 2021-09-05 DIAGNOSIS — Z419 Encounter for procedure for purposes other than remedying health state, unspecified: Secondary | ICD-10-CM | POA: Diagnosis not present

## 2021-10-06 DIAGNOSIS — Z419 Encounter for procedure for purposes other than remedying health state, unspecified: Secondary | ICD-10-CM | POA: Diagnosis not present

## 2021-11-06 DIAGNOSIS — Z419 Encounter for procedure for purposes other than remedying health state, unspecified: Secondary | ICD-10-CM | POA: Diagnosis not present

## 2021-12-04 DIAGNOSIS — Z419 Encounter for procedure for purposes other than remedying health state, unspecified: Secondary | ICD-10-CM | POA: Diagnosis not present

## 2022-01-04 DIAGNOSIS — Z419 Encounter for procedure for purposes other than remedying health state, unspecified: Secondary | ICD-10-CM | POA: Diagnosis not present

## 2022-01-28 IMAGING — US US MFM OB FOLLOW-UP
2 series · 13 of 28 positions shown · non-contrast
Comparison: none

[Series 1: us mfm ob follow-up · 9 of 41 slices shown (1 of 2)]
[im 3/41]
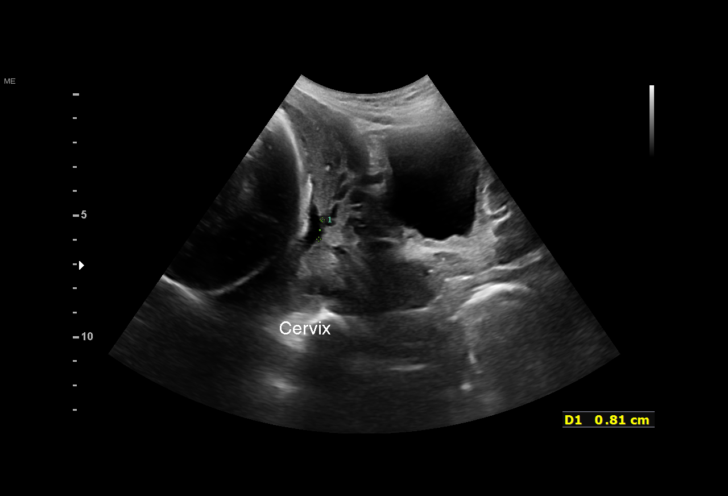
[im 7/41]
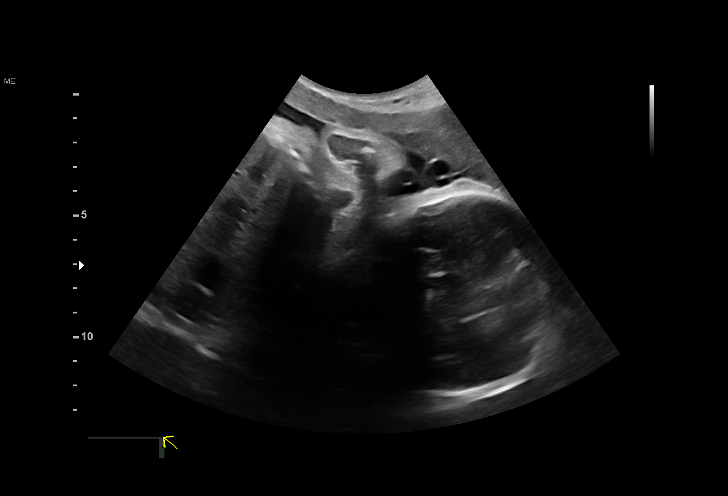
[im 11/41]
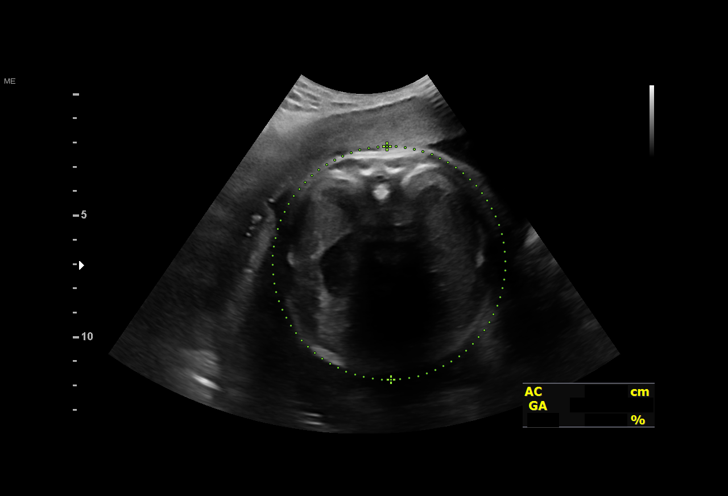
[im 15/41]
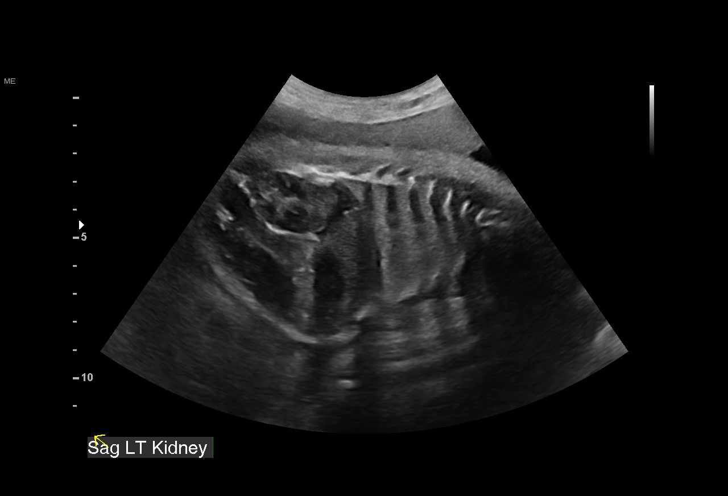
[im 19/41]
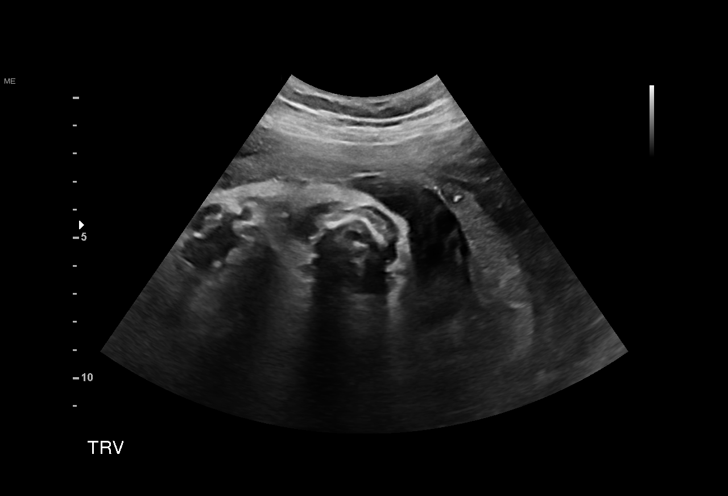
[im 24/41]
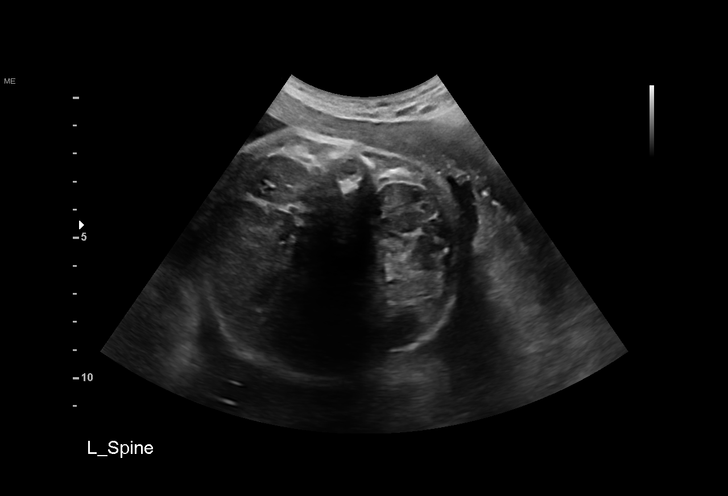
[im 30/41]
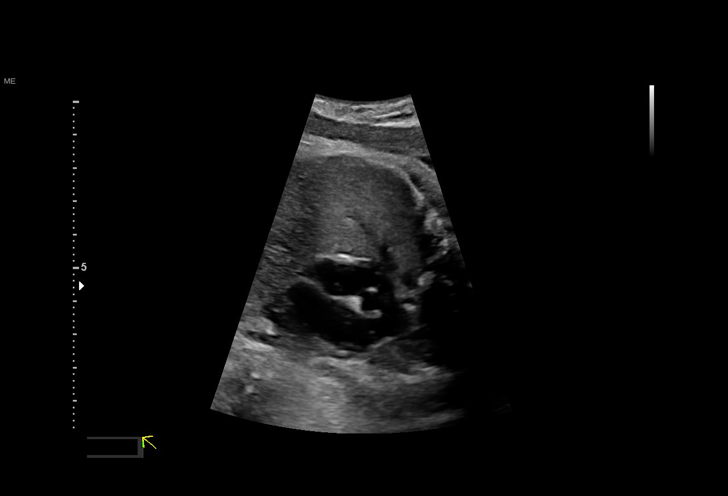
[im 34/41]
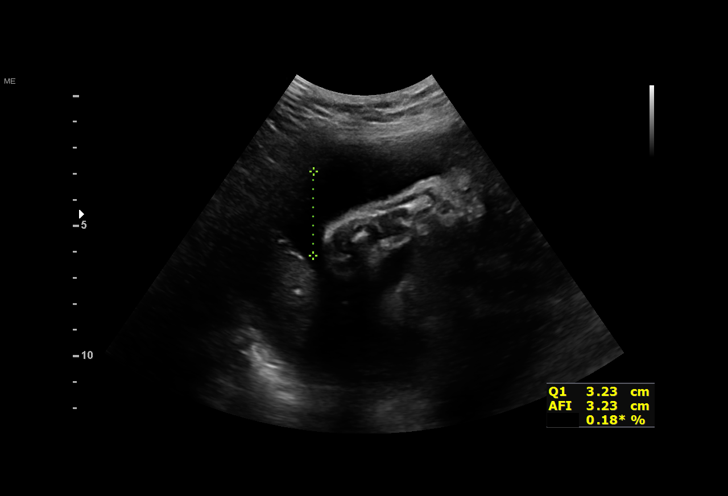
[im 38/41]
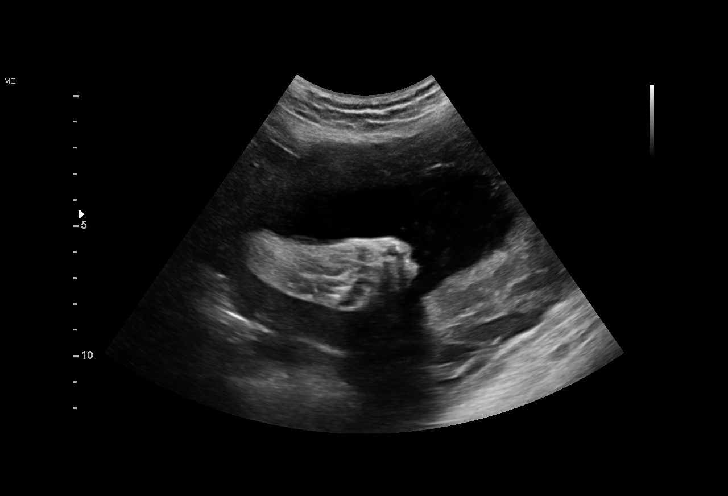

[Series 3: us mfm ob follow-up · 4 of 15 slices shown (2 of 2)]
[im 1/15]
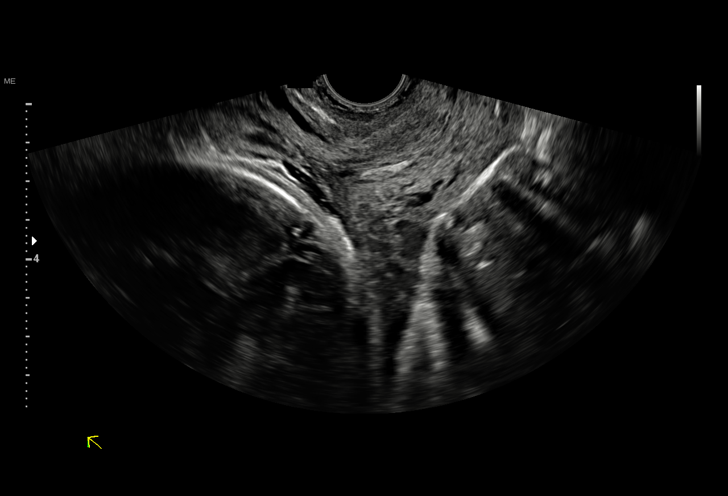
[im 5/15]
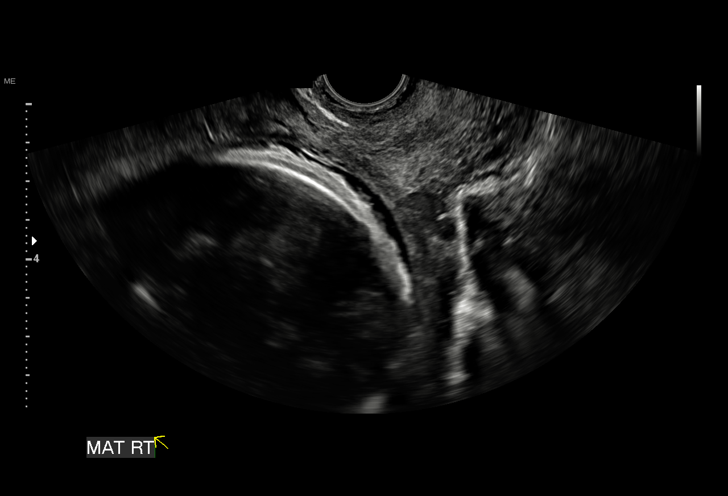
[im 9/15]
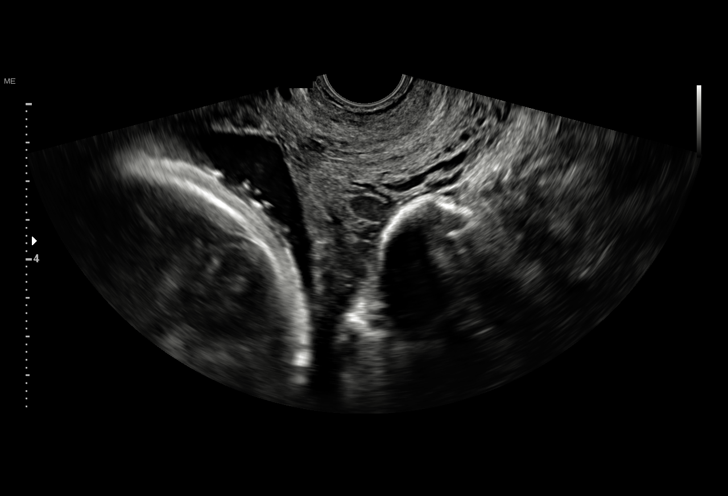
[im 13/15]
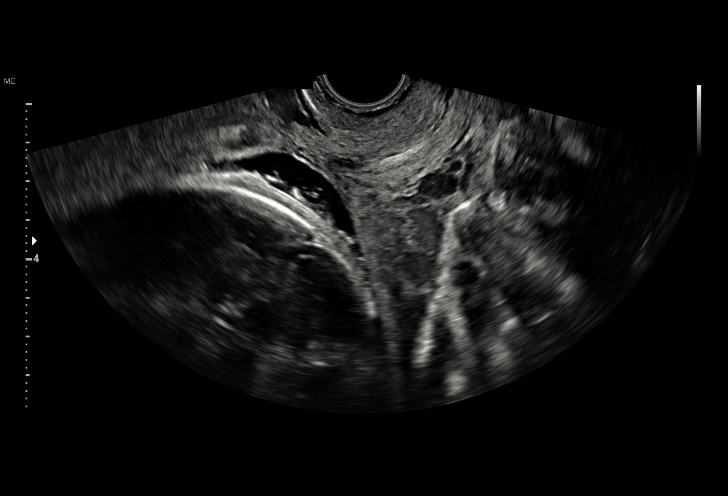

[13 of 28 positions shown; findings below may reference images not displayed]

GAY

 1  US MFM OB TRANSVAGINAL                76817.2     NAGYFI
                                                      GAY
                                                      GAY

Indications

 34 weeks gestation of pregnancy
 Placenta previa specified as without
 hemorrhage, third trimester
Fetal Evaluation

 Num Of Fetuses:         1
 Fetal Heart Rate(bpm):  135
 Cardiac Activity:       Observed
 Presentation:           Cephalic
 Placenta:               Anterior

 AFI Sum(cm)     %Tile       Largest Pocket(cm)
 17.3            63

 RUQ(cm)       RLQ(cm)       LUQ(cm)        LLQ(cm)

Biometry

 BPD:      84.1  mm     G. Age:  33w 6d         26  %    CI:        76.38   %    70 - 86
                                                         FL/HC:      21.1   %    20.1 -
 HC:      304.9  mm     G. Age:  33w 6d          6  %    HC/AC:      1.02        0.93 -
 AC:      300.2  mm     G. Age:  34w 0d         34  %    FL/BPD:     76.6   %    71 - 87
 FL:       64.4  mm     G. Age:  33w 2d         11  %    FL/AC:      21.5   %    20 - 24

 Est. FW:    5580  gm           5 lb     21  %
Gestational Age

 Clinical EDD:  34w 5d                                        EDD:   10/20/20
 U/S Today:     33w 5d                                        EDD:   10/27/20
 Best:          34w 5d     Det. By:  Early Ultrasound         EDD:   10/20/20
                                     (03/06/20)
Cervix Uterus Adnexa

 Cervix
 Length:           4.85  cm.
Impression

 Patient returned for fetal growth and placental location
 assessments. Placenta previa was diagnosed on previous
 scan. She does not give history of vaginal bleeding. She had
 mild uterine contractions on arrival that subsided during
 ultrasound.
 Obstetric history is significant for a term vaginal delivery 12
 years ago.
 She has iron-deficiency anemia, and her recent hemoglobin
 is 8.7 g/dL.
 BP at our office is 119/76 mm Hg.
 Fetal growth is appropriate for gestational age. Amniotic fluid
 is normal and good fetal activity is seen. Cephalic
 presentation. Placenta is anterior and on transabdominal
 ultrasound, no placenta previa is seen.
 After explaining, we performed transvaginal ultrasound to
 evaluate the placenta. A thin membranous portion (chorion)
 of the placenta is seen 1.4 cm from the internal os. Color-
 Doppler did not show any fetal or maternal vessels near the
 internal os. The thick placental edge is 2.4 cm from the
 internal os. To facilitate proper examination, the fetal head
 was displaced upward (by the FAYASMOHAMED).
 The cervix looks long and closed.
 Counseling as follows:
 -Although a small membranous portion of the placenta is
 closer to the internal os, it is unlikely to cause hemorrhage
 during labor. Descent of fetal head is likely to compress the
 placental edge and she should be able to deliver without any
 complications.
 -The thicker placental (vascular) edge is not low lying. No
 evidence of vasa previa.
 -Vaginal delivery is highly likely.
 -Patient can await spontaneous onset of labor. Alternatively,
 induction of labor may be considered after 39 weeks if cervix
 is favorable and patient desires planned delivery.
 -Spontaneous labor is accompanied by some amount of
 vaginal bleeding.
 Cesarean section would be considered if
 -Patient has significant vaginal bleeding at or after 36 weeks'
 gestation. Inpatient management would be recommended if
 patient has vaginal bleeding before 36 weeks' gestation.
 -Patient has significant bleeding during labor that cannot be
 accounted for normal amount of bleeding that accompanies
 labor.
Recommendations

 -Follow-up scans as clinically indicated.
 -Vaginal delivery can be attempted and cesarean section for
 reasons mentioned above.
 -Blood to be available at short noticd during delivery (iron
 deficiency anemia).
                 Johanson, Tawana

## 2022-02-03 DIAGNOSIS — Z419 Encounter for procedure for purposes other than remedying health state, unspecified: Secondary | ICD-10-CM | POA: Diagnosis not present

## 2022-03-06 DIAGNOSIS — Z419 Encounter for procedure for purposes other than remedying health state, unspecified: Secondary | ICD-10-CM | POA: Diagnosis not present

## 2022-04-05 DIAGNOSIS — Z419 Encounter for procedure for purposes other than remedying health state, unspecified: Secondary | ICD-10-CM | POA: Diagnosis not present

## 2022-05-06 DIAGNOSIS — Z419 Encounter for procedure for purposes other than remedying health state, unspecified: Secondary | ICD-10-CM | POA: Diagnosis not present

## 2022-06-06 DIAGNOSIS — Z419 Encounter for procedure for purposes other than remedying health state, unspecified: Secondary | ICD-10-CM | POA: Diagnosis not present

## 2022-07-06 DIAGNOSIS — Z419 Encounter for procedure for purposes other than remedying health state, unspecified: Secondary | ICD-10-CM | POA: Diagnosis not present

## 2022-08-06 DIAGNOSIS — Z419 Encounter for procedure for purposes other than remedying health state, unspecified: Secondary | ICD-10-CM | POA: Diagnosis not present

## 2022-09-05 DIAGNOSIS — Z419 Encounter for procedure for purposes other than remedying health state, unspecified: Secondary | ICD-10-CM | POA: Diagnosis not present

## 2022-10-06 DIAGNOSIS — Z419 Encounter for procedure for purposes other than remedying health state, unspecified: Secondary | ICD-10-CM | POA: Diagnosis not present

## 2022-11-06 DIAGNOSIS — Z419 Encounter for procedure for purposes other than remedying health state, unspecified: Secondary | ICD-10-CM | POA: Diagnosis not present

## 2022-12-05 DIAGNOSIS — Z419 Encounter for procedure for purposes other than remedying health state, unspecified: Secondary | ICD-10-CM | POA: Diagnosis not present

## 2023-02-09 ENCOUNTER — Telehealth: Payer: Self-pay

## 2023-02-09 NOTE — Telephone Encounter (Signed)
Sending mychart msg. AS, CMA 

## 2023-02-21 ENCOUNTER — Emergency Department: Payer: BLUE CROSS/BLUE SHIELD

## 2023-02-21 ENCOUNTER — Inpatient Hospital Stay
Admission: EM | Admit: 2023-02-21 | Discharge: 2023-02-23 | DRG: 445 | Disposition: A | Discharge status: Home / Self Care | Payer: BLUE CROSS/BLUE SHIELD | Attending: Internal Medicine | Admitting: Internal Medicine

## 2023-02-21 ENCOUNTER — Other Ambulatory Visit: Payer: Self-pay

## 2023-02-21 ENCOUNTER — Encounter: Payer: Self-pay | Admitting: Oncology

## 2023-02-21 DIAGNOSIS — Z9851 Tubal ligation status: Secondary | ICD-10-CM

## 2023-02-21 DIAGNOSIS — Y900 Blood alcohol level of less than 20 mg/100 ml: Secondary | ICD-10-CM | POA: Diagnosis present

## 2023-02-21 DIAGNOSIS — F1090 Alcohol use, unspecified, uncomplicated: Secondary | ICD-10-CM | POA: Diagnosis present

## 2023-02-21 DIAGNOSIS — Z888 Allergy status to other drugs, medicaments and biological substances status: Secondary | ICD-10-CM

## 2023-02-21 DIAGNOSIS — F17210 Nicotine dependence, cigarettes, uncomplicated: Secondary | ICD-10-CM | POA: Diagnosis present

## 2023-02-21 DIAGNOSIS — Z8379 Family history of other diseases of the digestive system: Secondary | ICD-10-CM

## 2023-02-21 DIAGNOSIS — Z833 Family history of diabetes mellitus: Secondary | ICD-10-CM

## 2023-02-21 DIAGNOSIS — N390 Urinary tract infection, site not specified: Secondary | ICD-10-CM

## 2023-02-21 DIAGNOSIS — Z79899 Other long term (current) drug therapy: Secondary | ICD-10-CM

## 2023-02-21 DIAGNOSIS — R7989 Other specified abnormal findings of blood chemistry: Secondary | ICD-10-CM | POA: Diagnosis present

## 2023-02-21 DIAGNOSIS — K759 Inflammatory liver disease, unspecified: Principal | ICD-10-CM

## 2023-02-21 DIAGNOSIS — F1490 Cocaine use, unspecified, uncomplicated: Secondary | ICD-10-CM | POA: Diagnosis present

## 2023-02-21 DIAGNOSIS — Z803 Family history of malignant neoplasm of breast: Secondary | ICD-10-CM

## 2023-02-21 DIAGNOSIS — A059 Bacterial foodborne intoxication, unspecified: Secondary | ICD-10-CM | POA: Diagnosis present

## 2023-02-21 DIAGNOSIS — K8070 Calculus of gallbladder and bile duct without cholecystitis without obstruction: Secondary | ICD-10-CM | POA: Diagnosis not present

## 2023-02-21 DIAGNOSIS — K8020 Calculus of gallbladder without cholecystitis without obstruction: Secondary | ICD-10-CM | POA: Diagnosis present

## 2023-02-21 DIAGNOSIS — R748 Abnormal levels of other serum enzymes: Secondary | ICD-10-CM | POA: Diagnosis present

## 2023-02-21 DIAGNOSIS — Z801 Family history of malignant neoplasm of trachea, bronchus and lung: Secondary | ICD-10-CM

## 2023-02-21 DIAGNOSIS — E860 Dehydration: Secondary | ICD-10-CM | POA: Diagnosis present

## 2023-02-21 LAB — CBC
HCT: 40.4 % (ref 36.0–46.0)
Hemoglobin: 13.3 g/dL (ref 12.0–15.0)
MCH: 31 pg (ref 26.0–34.0)
MCHC: 32.9 g/dL (ref 30.0–36.0)
MCV: 94.2 fL (ref 80.0–100.0)
Platelets: 209 10*3/uL (ref 150–400)
RBC: 4.29 MIL/uL (ref 3.87–5.11)
RDW: 13.8 % (ref 11.5–15.5)
WBC: 4.4 10*3/uL (ref 4.0–10.5)
nRBC: 0 % (ref 0.0–0.2)

## 2023-02-21 LAB — LIPASE, BLOOD: Lipase: 25 U/L (ref 11–51)

## 2023-02-21 LAB — URINALYSIS, ROUTINE W REFLEX MICROSCOPIC
Bilirubin Urine: NEGATIVE
Glucose, UA: NEGATIVE mg/dL
Hgb urine dipstick: NEGATIVE
Ketones, ur: NEGATIVE mg/dL
Nitrite: NEGATIVE
Protein, ur: NEGATIVE mg/dL
Specific Gravity, Urine: 1.019 (ref 1.005–1.030)
WBC, UA: >50 WBC/hpf (ref 0–5)
pH: 8 (ref 5.0–8.0)

## 2023-02-21 LAB — COMPREHENSIVE METABOLIC PANEL
ALT: 455 U/L — ABNORMAL HIGH (ref 0–44)
AST: 1315 U/L — ABNORMAL HIGH (ref 15–41)
Albumin: 4.7 g/dL (ref 3.5–5.0)
Alkaline Phosphatase: 119 U/L (ref 38–126)
Anion gap: 8 (ref 5–15)
BUN: 16 mg/dL (ref 6–20)
CO2: 27 mmol/L (ref 22–32)
Calcium: 9.3 mg/dL (ref 8.9–10.3)
Chloride: 102 mmol/L (ref 98–111)
Creatinine, Ser: 0.78 mg/dL (ref 0.44–1.00)
GFR, Estimated: >60 mL/min (ref >60)
Glucose, Bld: 112 mg/dL — ABNORMAL HIGH (ref 70–99)
Potassium: 4.1 mmol/L (ref 3.5–5.1)
Sodium: 137 mmol/L (ref 135–145)
Total Bilirubin: 2.7 mg/dL — ABNORMAL HIGH (ref 0.3–1.2)
Total Protein: 8.2 g/dL — ABNORMAL HIGH (ref 6.5–8.1)

## 2023-02-21 LAB — POC URINE PREG, ED: Preg Test, Ur: NEGATIVE

## 2023-02-21 MED ORDER — GADOBUTROL 1 MMOL/ML IV SOLN
6.0000 mL | Freq: Once | INTRAVENOUS | Status: AC | PRN
  Administered 2023-02-21: 6 mL via INTRAVENOUS

## 2023-02-21 MED ORDER — MORPHINE SULFATE (PF) 4 MG/ML IV SOLN
4.0000 mg | Freq: Once | INTRAVENOUS | Status: AC
  Administered 2023-02-21: 4 mg via INTRAVENOUS
  Filled 2023-02-21: qty 1

## 2023-02-21 MED ORDER — ONDANSETRON 4 MG PO TBDP
4.0000 mg | ORAL_TABLET | Freq: Once | ORAL | Status: AC | PRN
  Administered 2023-02-21: 4 mg via ORAL
  Filled 2023-02-21: qty 1

## 2023-02-21 MED ORDER — ONDANSETRON HCL 4 MG/2ML IJ SOLN
4.0000 mg | Freq: Once | INTRAMUSCULAR | Status: AC
  Administered 2023-02-21: 4 mg via INTRAVENOUS
  Filled 2023-02-21: qty 2

## 2023-02-21 MED ORDER — SODIUM CHLORIDE 0.9 % IV BOLUS
1000.0000 mL | Freq: Once | INTRAVENOUS | Status: AC
  Administered 2023-02-21: 1000 mL via INTRAVENOUS

## 2023-02-21 MED ORDER — MORPHINE SULFATE (PF) 2 MG/ML IV SOLN
2.0000 mg | Freq: Once | INTRAVENOUS | Status: AC
  Administered 2023-02-21: 2 mg via INTRAVENOUS
  Filled 2023-02-21: qty 1

## 2023-02-21 NOTE — ED Triage Notes (Signed)
Pt to ED from home. Pt did some ETOH shots laswt night and then went to [employer] for late night breakfast and has been throwing up since. Pt is CAOx4 and in no acute distress and no active vomiting at this time.

## 2023-02-21 NOTE — ED Provider Notes (Signed)
[facility] Provider Note  Patient Contact: 5:35 PM (approximate)   History   Emesis (X 10 hours)   HPI  Tammy Frey is a 37 y.o. female who presents to the emergency department complaining of right upper quadrant abdominal pain, nausea vomiting.  Patient states that she went out last night, had several drinks, as well as eating at [facility] and started having nausea vomiting.  She thought she had food poisoning until symptoms have persisted throughout the day.  No fevers, chills.  No diarrhea or constipation.     Physical Exam   Triage Vital Signs: ED Triage Vitals  Enc Vitals Group     BP 02/21/23 1518 (!) 142/92     Pulse Rate 02/21/23 1518 (!) 101     Resp 02/21/23 1518 16     Temp 02/21/23 1518 98.2 F (36.8 C)     Temp Source 02/21/23 1518 Oral     SpO2 02/21/23 1518 99 %     Weight 02/21/23 1519 140 lb (63.5 kg)     Height 02/21/23 1519 5\' 7"  (1.702 m)     Head Circumference --      Peak Flow --      Pain Score 02/21/23 1519 0     Pain Loc --      Pain Edu? --      Excl. in GC? --     Most recent vital signs: Vitals:   02/21/23 2015 02/21/23 2326  BP:  121/67  Pulse: (!) 43 61  Resp:  18  Temp:  98.1 F (36.7 C)  SpO2: 100% 100%     General: Alert and in no acute distress.   Cardiovascular:  Good peripheral perfusion Respiratory: Normal respiratory effort without tachypnea or retractions. Lungs CTAB.  Gastrointestinal: Bowel sounds 4 quadrants.  Soft to palpation.  Patient is tender in the epigastric and right upper quadrant regions.. No guarding or rigidity. No palpable masses. No distention. No CVA tenderness. Musculoskeletal: Full range of motion to all extremities.  Neurologic:  No gross focal neurologic deficits are appreciated.  Skin:   No rash noted Other:   ED Results / Procedures / Treatments   Labs (all labs ordered are listed, but only abnormal results are displayed) Labs Reviewed  COMPREHENSIVE  METABOLIC PANEL - Abnormal; Notable for the following components:      Result Value   Glucose, Bld 112 (*)    Total Protein 8.2 (*)    AST 1,315 (*)    ALT 455 (*)    Total Bilirubin 2.7 (*)    All other components within normal limits  URINALYSIS, ROUTINE W REFLEX MICROSCOPIC - Abnormal; Notable for the following components:   Color, Urine AMBER (*)    APPearance HAZY (*)    Leukocytes,Ua MODERATE (*)    Bacteria, UA RARE (*)    All other components within normal limits  LIPASE, BLOOD  CBC  PROTIME-INR  POC URINE PREG, ED     EKG     RADIOLOGY  I personally viewed, evaluated, and interpreted these images as part of my medical decision making, as well as reviewing the written report by the radiologist.  ED Provider Interpretation: MRCP reveals cholelithiasis without biliary ductal dilation or choledocholithiasis.  Ultrasound revealed no evidence of cholecystitis, cholelithiasis and possible biliary ductal dilation  MR ABDOMEN MRCP W WO CONTAST  Result Date: 02/21/2023 CLINICAL DATA:  Cholelithiasis, biliary ductal dilation EXAM: MRI ABDOMEN WITHOUT AND WITH CONTRAST (INCLUDING MRCP)  TECHNIQUE: Multiplanar multisequence MR imaging of the abdomen was performed both before and after the administration of intravenous contrast. Heavily T2-weighted images of the biliary and pancreatic ducts were obtained, and three-dimensional MRCP images were rendered by post processing. CONTRAST:  6mL GADAVIST GADOBUTROL 1 MMOL/ML IV SOLN COMPARISON:  None Available. FINDINGS: Lower chest: No acute findings. Hepatobiliary: Normal hepatic parenchymal echogenicity. No intrahepatic masses identified. No intra or extrahepatic biliary ductal dilation. The extrahepatic bile duct measures 6-7 mm proximally and tapers normally toward the ampulla. No intraluminal filling defect identified to suggest choledocholithiasis. Normal biliary anatomy. Cholelithiasis noted without superimposed pericholecystic  inflammatory change. Pancreas: No mass, inflammatory changes, or other parenchymal abnormality identified. No pancreatic divisum Spleen:  Within normal limits in size and appearance. Adrenals/Urinary Tract: The adrenal glands are unremarkable. The kidneys are normal in size and position. Tiny simple cortical cyst noted within the lower pole the left kidney for which no follow-up imaging is recommended. The kidneys are otherwise unremarkable. Stomach/Bowel: Visualized portions within the abdomen are unremarkable. Vascular/Lymphatic: No pathologically enlarged lymph nodes identified. No abdominal aortic aneurysm demonstrated. Other:  None. Musculoskeletal: No suspicious bone lesions identified. IMPRESSION: 1. Cholelithiasis without evidence for choledocholithiasis or biliary ductal dilation. Electronically Signed   By: Helyn Numbers M.D.   On: 02/21/2023 23:49   MR 3D Recon At Scanner  Result Date: 02/21/2023 CLINICAL DATA:  Cholelithiasis, biliary ductal dilation EXAM: MRI ABDOMEN WITHOUT AND WITH CONTRAST (INCLUDING MRCP) TECHNIQUE: Multiplanar multisequence MR imaging of the abdomen was performed both before and after the administration of intravenous contrast. Heavily T2-weighted images of the biliary and pancreatic ducts were obtained, and three-dimensional MRCP images were rendered by post processing. CONTRAST:  6mL GADAVIST GADOBUTROL 1 MMOL/ML IV SOLN COMPARISON:  None Available. FINDINGS: Lower chest: No acute findings. Hepatobiliary: Normal hepatic parenchymal echogenicity. No intrahepatic masses identified. No intra or extrahepatic biliary ductal dilation. The extrahepatic bile duct measures 6-7 mm proximally and tapers normally toward the ampulla. No intraluminal filling defect identified to suggest choledocholithiasis. Normal biliary anatomy. Cholelithiasis noted without superimposed pericholecystic inflammatory change. Pancreas: No mass, inflammatory changes, or other parenchymal abnormality  identified. No pancreatic divisum Spleen:  Within normal limits in size and appearance. Adrenals/Urinary Tract: The adrenal glands are unremarkable. The kidneys are normal in size and position. Tiny simple cortical cyst noted within the lower pole the left kidney for which no follow-up imaging is recommended. The kidneys are otherwise unremarkable. Stomach/Bowel: Visualized portions within the abdomen are unremarkable. Vascular/Lymphatic: No pathologically enlarged lymph nodes identified. No abdominal aortic aneurysm demonstrated. Other:  None. Musculoskeletal: No suspicious bone lesions identified. IMPRESSION: 1. Cholelithiasis without evidence for choledocholithiasis or biliary ductal dilation. Electronically Signed   By: Helyn Numbers M.D.   On: 02/21/2023 23:49   US Abdomen Limited RUQ (LIVER/GB)  Result Date: 02/21/2023 CLINICAL DATA:  Right upper quadrant abdominal EXAM: ULTRASOUND ABDOMEN LIMITED RIGHT UPPER QUADRANT COMPARISON:  None Available. FINDINGS: Gallbladder: Cholelithiasis. No gallbladder wall thickening. No sonographic Murphy sign noted by sonographer. Common bile duct: Diameter: 8 mm Liver: No focal lesion identified. Mild intrahepatic bile duct dilation. Portal vein is patent on color Doppler imaging with normal direction of blood flow towards the liver. Other: None. IMPRESSION: 1. Cholelithiasis without sonographic evidence of acute cholecystitis. 2. Mild intrahepatic and extrahepatic bile duct dilation. Recommend correlation with liver function tests. Electronically Signed   By: Agustin Cree M.D.   On: 02/21/2023 18:26    PROCEDURES:  Critical Care performed: No  Procedures   MEDICATIONS  ORDERED IN ED: Medications  ondansetron (ZOFRAN-ODT) disintegrating tablet 4 mg (4 mg Oral Given 02/21/23 1522)  sodium chloride 0.9 % bolus 1,000 mL (0 mLs Intravenous Stopped 02/21/23 2159)  ondansetron (ZOFRAN) injection 4 mg (4 mg Intravenous Given 02/21/23 1901)  morphine (PF) 4 MG/ML  injection 4 mg (4 mg Intravenous Given 02/21/23 1904)  gadobutrol (GADAVIST) 1 MMOL/ML injection 6 mL (6 mLs Intravenous Contrast Given 02/21/23 2101)  morphine (PF) 2 MG/ML injection 2 mg (2 mg Intravenous Given 02/21/23 2323)     IMPRESSION / MDM / ASSESSMENT AND PLAN / ED COURSE  I reviewed the triage vital signs and the nursing notes.                              Clinical Course as of 02/22/23 0026  Sat Feb 21, 2023  1944 Patient has epigastric and right upper quadrant abdominal pain with nausea, vomiting.  No diarrhea.  Patient's AST and ALT are significantly elevated as is the T. bili.  Patient's ultrasound consistent with cholelithiasis with probable dilated common bile duct.  Will order MRCP at this time.  There was no evidence of cholecystitis, and ultrasound.  Will reach out to GI after MRCP. [JC]    Clinical Course User Index [JC] Zykia Walla, Delorise Royals, PA-C    Differential diagnosis includes, but is not limited to, cholecystitis, choledocholithiasis, pancreatitis, gastritis, hepatitis  Patient's presentation is most consistent with acute presentation with potential threat to life or bodily function.   Patient presents the emergency department complaining of epigastric and right upper quadrant abdominal pain with send onset earlier today.  She been out overnight and drinking and eating at [employer].  She thought that with her nausea and vomiting that she had contracted food poisoning.  Patient states that she had a couple of shots but did not binge drink.  She has no history of hepatitis, biliary colic, gastritis or pancreatitis.  Tender on exam in the epigastric and right upper quadrant.  Patient has been receiving pain medication, nausea medication through her stay in the emergency department.  Initial labs revealed significantly elevated AST and ALT as well as elevation in T. bili.  Ultrasound revealed no evidence of cholecystitis but had cholelithiasis and questionable biliary  ductal dilation.  To confirm this I ordered MRCP which returns with cholelithiasis but no evidence of biliary ductal dilation or choledocholithiasis.  I reached out to on-call GI provider, Dr. Tobi Bastos who advised that this looks more hepatitis like based off of the labs.  He recommends obtaining a PT/INR.  If this is elevated she will need transfer to [facility] or [facility].  If her INR is reassuring then patient may be admitted for workup for hepatitis here in our hospital system.  At shift change I will handover the patient to attending provider, Dr. Dolores Frame.  She will await PT/INR to differentiate whether patient will need transfer or admission.  Final diagnosis and disposition for her emergency department stay will be provided by attending provider Dr. Dolores Frame.    Note:  This document was prepared using Dragon voice recognition software and may include unintentional dictation errors.   Racheal Patches, PA-C 02/22/23 0027    Irean Hong, MD 02/22/23 [phone]

## 2023-02-22 ENCOUNTER — Encounter: Payer: Self-pay | Admitting: Oncology

## 2023-02-22 ENCOUNTER — Encounter: Payer: Self-pay | Admitting: Family Medicine

## 2023-02-22 DIAGNOSIS — A059 Bacterial foodborne intoxication, unspecified: Secondary | ICD-10-CM | POA: Diagnosis present

## 2023-02-22 DIAGNOSIS — Z8379 Family history of other diseases of the digestive system: Secondary | ICD-10-CM | POA: Diagnosis not present

## 2023-02-22 DIAGNOSIS — E860 Dehydration: Secondary | ICD-10-CM | POA: Diagnosis present

## 2023-02-22 DIAGNOSIS — R7989 Other specified abnormal findings of blood chemistry: Secondary | ICD-10-CM | POA: Diagnosis not present

## 2023-02-22 DIAGNOSIS — Z833 Family history of diabetes mellitus: Secondary | ICD-10-CM | POA: Diagnosis not present

## 2023-02-22 DIAGNOSIS — F1490 Cocaine use, unspecified, uncomplicated: Secondary | ICD-10-CM | POA: Diagnosis present

## 2023-02-22 DIAGNOSIS — Y900 Blood alcohol level of less than 20 mg/100 ml: Secondary | ICD-10-CM | POA: Diagnosis present

## 2023-02-22 DIAGNOSIS — N390 Urinary tract infection, site not specified: Secondary | ICD-10-CM | POA: Diagnosis present

## 2023-02-22 DIAGNOSIS — F1090 Alcohol use, unspecified, uncomplicated: Secondary | ICD-10-CM | POA: Diagnosis present

## 2023-02-22 DIAGNOSIS — Z888 Allergy status to other drugs, medicaments and biological substances status: Secondary | ICD-10-CM | POA: Diagnosis not present

## 2023-02-22 DIAGNOSIS — Z9851 Tubal ligation status: Secondary | ICD-10-CM | POA: Diagnosis not present

## 2023-02-22 DIAGNOSIS — F17210 Nicotine dependence, cigarettes, uncomplicated: Secondary | ICD-10-CM | POA: Diagnosis present

## 2023-02-22 DIAGNOSIS — K8070 Calculus of gallbladder and bile duct without cholecystitis without obstruction: Secondary | ICD-10-CM | POA: Diagnosis present

## 2023-02-22 DIAGNOSIS — K8020 Calculus of gallbladder without cholecystitis without obstruction: Secondary | ICD-10-CM

## 2023-02-22 DIAGNOSIS — R748 Abnormal levels of other serum enzymes: Secondary | ICD-10-CM | POA: Diagnosis present

## 2023-02-22 DIAGNOSIS — Z803 Family history of malignant neoplasm of breast: Secondary | ICD-10-CM | POA: Diagnosis not present

## 2023-02-22 DIAGNOSIS — Z801 Family history of malignant neoplasm of trachea, bronchus and lung: Secondary | ICD-10-CM | POA: Diagnosis not present

## 2023-02-22 DIAGNOSIS — Z79899 Other long term (current) drug therapy: Secondary | ICD-10-CM | POA: Diagnosis not present

## 2023-02-22 DIAGNOSIS — R112 Nausea with vomiting, unspecified: Secondary | ICD-10-CM | POA: Diagnosis not present

## 2023-02-22 LAB — BASIC METABOLIC PANEL
Anion gap: 4 — ABNORMAL LOW (ref 5–15)
BUN: 11 mg/dL (ref 6–20)
CO2: 26 mmol/L (ref 22–32)
Calcium: 8.4 mg/dL — ABNORMAL LOW (ref 8.9–10.3)
Chloride: 107 mmol/L (ref 98–111)
Creatinine, Ser: 0.77 mg/dL (ref 0.44–1.00)
GFR, Estimated: >60 mL/min (ref >60)
Glucose, Bld: 105 mg/dL — ABNORMAL HIGH (ref 70–99)
Potassium: 3.4 mmol/L — ABNORMAL LOW (ref 3.5–5.1)
Sodium: 137 mmol/L (ref 135–145)

## 2023-02-22 LAB — CBC
HCT: 32.9 % — ABNORMAL LOW (ref 36.0–46.0)
Hemoglobin: 10.7 g/dL — ABNORMAL LOW (ref 12.0–15.0)
MCH: 31.4 pg (ref 26.0–34.0)
MCHC: 32.5 g/dL (ref 30.0–36.0)
MCV: 96.5 fL (ref 80.0–100.0)
Platelets: 156 10*3/uL (ref 150–400)
RBC: 3.41 MIL/uL — ABNORMAL LOW (ref 3.87–5.11)
RDW: 13.9 % (ref 11.5–15.5)
WBC: 2.9 10*3/uL — ABNORMAL LOW (ref 4.0–10.5)
nRBC: 0 % (ref 0.0–0.2)

## 2023-02-22 LAB — HEPATITIS PANEL, ACUTE
HCV Ab: NONREACTIVE
Hep A IgM: NONREACTIVE
Hep B C IgM: NONREACTIVE
Hepatitis B Surface Ag: NONREACTIVE

## 2023-02-22 LAB — HEPATIC FUNCTION PANEL
ALT: 456 U/L — ABNORMAL HIGH (ref 0–44)
AST: 571 U/L — ABNORMAL HIGH (ref 15–41)
Albumin: 3.5 g/dL (ref 3.5–5.0)
Alkaline Phosphatase: 102 U/L (ref 38–126)
Bilirubin, Direct: 1.5 mg/dL — ABNORMAL HIGH (ref 0.0–0.2)
Indirect Bilirubin: 1.7 mg/dL — ABNORMAL HIGH (ref 0.3–0.9)
Total Bilirubin: 3.2 mg/dL — ABNORMAL HIGH (ref 0.3–1.2)
Total Protein: 6.4 g/dL — ABNORMAL LOW (ref 6.5–8.1)

## 2023-02-22 LAB — URINE DRUG SCREEN, QUALITATIVE (ARMC ONLY)
Amphetamines, Ur Screen: NOT DETECTED
Barbiturates, Ur Screen: NOT DETECTED
Benzodiazepine, Ur Scrn: NOT DETECTED
Cannabinoid 50 Ng, Ur ~~LOC~~: POSITIVE — AB
Cocaine Metabolite,Ur ~~LOC~~: POSITIVE — AB
MDMA (Ecstasy)Ur Screen: NOT DETECTED
Methadone Scn, Ur: NOT DETECTED
Opiate, Ur Screen: NOT DETECTED
Phencyclidine (PCP) Ur S: NOT DETECTED
Tricyclic, Ur Screen: NOT DETECTED

## 2023-02-22 LAB — PROTIME-INR
INR: 1.1 (ref 0.8–1.2)
Prothrombin Time: 14 seconds (ref 11.4–15.2)

## 2023-02-22 LAB — HIV ANTIBODY (ROUTINE TESTING W REFLEX): HIV Screen 4th Generation wRfx: NONREACTIVE

## 2023-02-22 LAB — ETHANOL: Alcohol, Ethyl (B): <10 mg/dL (ref <10)

## 2023-02-22 LAB — ACETAMINOPHEN LEVEL: Acetaminophen (Tylenol), Serum: <10 ug/mL — ABNORMAL LOW (ref 10–30)

## 2023-02-22 MED ORDER — ONDANSETRON HCL 4 MG PO TABS: 4.0000 mg | ORAL_TABLET | Freq: Four times a day (QID) | ORAL | Status: DC | PRN

## 2023-02-22 MED ORDER — MORPHINE SULFATE (PF) 2 MG/ML IV SOLN
2.0000 mg | INTRAVENOUS | Status: DC | PRN
  Administered 2023-02-22 (×4): 2 mg via INTRAVENOUS
  Filled 2023-02-22 (×4): qty 1

## 2023-02-22 MED ORDER — NICOTINE 21 MG/24HR TD PT24
21.0000 mg | MEDICATED_PATCH | Freq: Every day | TRANSDERMAL | Status: DC
  Administered 2023-02-22: 21 mg via TRANSDERMAL
  Filled 2023-02-22: qty 1

## 2023-02-22 MED ORDER — ONDANSETRON HCL 4 MG/2ML IJ SOLN: 4.0000 mg | Freq: Four times a day (QID) | INTRAMUSCULAR | Status: DC | PRN

## 2023-02-22 MED ORDER — SODIUM CHLORIDE 0.9 % IV SOLN
1.0000 g | INTRAVENOUS | Status: DC
  Administered 2023-02-22 – 2023-02-23 (×2): 1 g via INTRAVENOUS
  Filled 2023-02-22 (×2): qty 10

## 2023-02-22 MED ORDER — PANTOPRAZOLE SODIUM 40 MG IV SOLR
40.0000 mg | Freq: Two times a day (BID) | INTRAVENOUS | Status: DC
  Administered 2023-02-22 – 2023-02-23 (×3): 40 mg via INTRAVENOUS
  Filled 2023-02-22 (×3): qty 10

## 2023-02-22 MED ORDER — POTASSIUM CHLORIDE 10 MEQ/100ML IV SOLN
10.0000 meq | INTRAVENOUS | Status: AC
  Administered 2023-02-22 (×2): 10 meq via INTRAVENOUS
  Filled 2023-02-22 (×2): qty 100

## 2023-02-22 MED ORDER — PIPERACILLIN-TAZOBACTAM 3.375 G IVPB 30 MIN: 3.3750 g | Freq: Once | INTRAVENOUS | Status: DC

## 2023-02-22 MED ORDER — ACETAMINOPHEN 650 MG RE SUPP: 650.0000 mg | Freq: Four times a day (QID) | RECTAL | Status: DC | PRN

## 2023-02-22 MED ORDER — MAGNESIUM HYDROXIDE 400 MG/5ML PO SUSP: 30.0000 mL | Freq: Every day | ORAL | Status: DC | PRN

## 2023-02-22 MED ORDER — METRONIDAZOLE 500 MG/100ML IV SOLN
500.0000 mg | Freq: Three times a day (TID) | INTRAVENOUS | Status: DC
  Administered 2023-02-22 – 2023-02-23 (×4): 500 mg via INTRAVENOUS
  Filled 2023-02-22 (×6): qty 100

## 2023-02-22 MED ORDER — SODIUM CHLORIDE 0.9 % IV SOLN: INTRAVENOUS | Status: DC

## 2023-02-22 MED ORDER — TRAZODONE HCL 50 MG PO TABS: 25.0000 mg | ORAL_TABLET | Freq: Every evening | ORAL | Status: DC | PRN

## 2023-02-22 MED ORDER — ENOXAPARIN SODIUM 40 MG/0.4ML IJ SOSY
40.0000 mg | PREFILLED_SYRINGE | INTRAMUSCULAR | Status: DC
  Administered 2023-02-22 – 2023-02-23 (×2): 40 mg via SUBCUTANEOUS
  Filled 2023-02-22 (×2): qty 0.4

## 2023-02-22 MED ORDER — ACETAMINOPHEN 325 MG PO TABS
650.0000 mg | ORAL_TABLET | Freq: Four times a day (QID) | ORAL | Status: DC | PRN
  Administered 2023-02-22: 650 mg via ORAL
  Filled 2023-02-22: qty 2

## 2023-02-22 MED ORDER — NICOTINE 21 MG/24HR TD PT24: 21.0000 mg | MEDICATED_PATCH | Freq: Every day | TRANSDERMAL | Status: DC

## 2023-02-22 MED ORDER — ORAL CARE MOUTH RINSE: 15.0000 mL | OROMUCOSAL | Status: DC | PRN

## 2023-02-22 NOTE — Progress Notes (Signed)
Brief rounding note, same day as admission  HPI: Pt admitted earlier this AM with RUQ abdominal pain and nausea/vomiting.  Found to have gallstones and elevated liver enzymes.  See full H&P for HPI on admission.  Interval history: Pt seen this AM in the ED, holding for a bed.  Reports abdominal pain now across lower abdomen.  No N/V this AM since admission.  No fever/chills.   Reports family history of multiple others needing cholecystectomies.  Exam: General exam: awake, alert, no acute distress HEENT: atraumatic, clear conjunctiva, anicteric sclera, moist mucus membranes, hearing grossly normal  Respiratory system: CTAB, no wheezes, rales or rhonchi, normal respiratory effort. Cardiovascular system: normal S1/S2, RRR.   Gastrointestinal system: soft, mild tenderness on palpation without guarding or rebound, +bowel sounds. Central nervous system: A&O x 4. no gross focal neurologic deficits, normal speech Extremities: moves all, no edema, normal tone Skin: dry, intact, normal temperature, normal color, No rashes, lesions or ulcers Psychiatry: normal mood, congruent affect, judgement and insight appear normal   A&P: as per H&P by Dr. Arville Care, with any changes or additions as below:  --Follow up GI and surgery's recommendations --IV K riders x 3 to replace potassium (3.3 this AM) --add on Urine culture --continue empiric antibiotics    No charge

## 2023-02-22 NOTE — Consult Note (Signed)
[name] , MD [address], [address], [place], [place], [address] [address], [address], [place], [place], [address] Phone: [phone]  Fax: [phone]  Consultation  Referring Provider:     ER Primary Care Physician:  Natale Milch, MD Primary Gastroenterologist: None          Reason for Consultation:     Abnormal LFT's   Date of Admission:  02/21/2023 Date of Consultation:  02/22/2023         HPI:   Tammy Frey is a 37 y.o. female presented to the emergency room for vomiting nausea abdominal pain in the epigastric area and right upper quadrant after she had a night out where she had several drinks and a meal at [facility] around 3 AM about 2 hours later she had multiple episodes of vomiting all day because she came into the hospital.  Presently the pain has significantly reduced and is mainly in the lower abdomen.  She denies any prior similar episodes.  Denies any exposure to marijuana.  She has a strong family history of multiple women who have had their gallbladder taken out.  She does mention that she has a negative [place] ancestry.  Denies any over-the-counter medications denies any herbal supplements denies any excess Tylenol denies any illegal drug use recently.  She personally thought she had food poisoning as the symptoms persisted all day long.  She has not had a bowel movement for over a day.  Denies any fever. On presentation to the emergency room she had an AST of 1315 ALT of 455 total bilirubin of 2.7.  Has improved markedly overnight to an AST of 571 ALT of 456 and total bilirubin of 3.2 with direct complaint of 1.5 and direct component of 1.7.  Hemoglobin at baseline is around 10.4 g on admission 10.7 g although in the emergency room it was 13.3 g probably due to an element of hemoconcentration.   INR on admission 1.1.  Blood ethanol level less than 10.   right upper quadrant ultrasound showed cholelithiasis without evidence of acute cholecystitis  mild intrahepatic and extrahepatic bile duct dilation.  MRCP showed cholelithiasis without evidence of choledocholithiasis.  Past Medical History:  Diagnosis Date   Anemia    IDA (iron deficiency anemia) 09/10/2020   Seizures (HCC)     Past Surgical History:  Procedure Laterality Date   CESAREAN SECTION WITH BILATERAL TUBAL LIGATION  10/13/2020   Procedure: CESAREAN SECTION WITH BILATERAL TUBAL LIGATION;  Surgeon: Nadara Mustard, MD;  Location: [facility] ORS;  Service: Obstetrics;;    Prior to Admission medications   Medication Sig Start Date End Date Taking? Authorizing Provider  acetaminophen (TYLENOL) 325 MG tablet Take 2 tablets (650 mg total) by mouth every 4 (four) hours as needed for mild pain (temperature > 101.5.). 10/15/20   Zipporah Plants, CNM  docusate sodium (COLACE) 100 MG capsule Take 100 mg by mouth daily.    [provider]  ferrous sulfate 325 (65 FE) MG EC tablet Take 325 mg by mouth daily with breakfast.    [provider]    Family History  Problem Relation Age of Onset   Diabetes Father    Breast cancer Paternal Aunt    Lung cancer Paternal Uncle    Cancer Paternal Grandfather      Social History   Tobacco Use   Smoking status: Every Day    Years: 20    Types: Cigarettes   Smokeless tobacco: Never  Tobacco comments:    3 cigarettes all day   Vaping Use   Vaping Use: Former   Devices: used vaping to quit smoking but did not work   Substance Use Topics   Alcohol use: Not Currently    Comment: occasionally- before preganancy    Drug use: Yes    Types: Marijuana    Comment: last time she smoked was the week of 08/06/20    Allergies as of 02/21/2023 - Review Complete 02/21/2023  Allergen Reaction Noted   Haldol [haloperidol]  03/06/2017    Review of Systems:    All systems reviewed and negative except where noted in HPI.   Physical Exam:  Vital signs in last 24 hours: Temp:  [98.1 F (36.7 C)-98.3 F (36.8 C)] 98.2 F (36.8 C)  (05/19 0900) Pulse Rate:  [41-101] 42 (05/19 0855) Resp:  [11-18] 11 (05/19 0855) BP: (120-142)/(67-92) 134/81 (05/19 0855) SpO2:  [98 %-100 %] 98 % (05/19 0855) Weight:  [63.5 kg] 63.5 kg (05/18 1519)   General:   Pleasant, cooperative in NAD Head:  Normocephalic and atraumatic. Eyes:   No icterus.   Conjunctiva pink. PERRLA. Ears:  Normal auditory acuity. Neck:  Supple; no masses or thyroidomegaly Lungs: Respirations even and unlabored. Lungs clear to auscultation bilaterally.   No wheezes, crackles, or rhonchi.  Heart:  Regular rate and rhythm;  Without murmur, clicks, rubs or gallops Abdomen:  Soft, nondistended, nontender. Normal bowel sounds. No appreciable masses or hepatomegaly.  No rebound or guarding.  Neurologic:  Alert and oriented x3;  grossly normal neurologically. Skin:  Intact without significant lesions or rashes. Cervical Nodes:  No significant cervical adenopathy. Psych:  Alert and cooperative. Normal affect.  LAB RESULTS: Recent Labs    02/21/23 1521 02/22/23 0533  WBC 4.4 2.9*  HGB 13.3 10.7*  HCT 40.4 32.9*  PLT 209 156   BMET Recent Labs    02/21/23 1521 02/22/23 0533  NA 137 137  K 4.1 3.4*  CL 102 107  CO2 27 26  GLUCOSE 112* 105*  BUN 16 11  CREATININE 0.78 0.77  CALCIUM 9.3 8.4*   LFT Recent Labs    02/22/23 0533  PROT 6.4*  ALBUMIN 3.5  AST 571*  ALT 456*  ALKPHOS 102  BILITOT 3.2*  BILIDIR 1.5*  IBILI 1.7*   PT/INR Recent Labs    02/22/23 0031  LABPROT 14.0  INR 1.1    STUDIES: MR ABDOMEN MRCP W WO CONTAST  Result Date: 02/21/2023 CLINICAL DATA:  Cholelithiasis, biliary ductal dilation EXAM: MRI ABDOMEN WITHOUT AND WITH CONTRAST (INCLUDING MRCP) TECHNIQUE: Multiplanar multisequence MR imaging of the abdomen was performed both before and after the administration of intravenous contrast. Heavily T2-weighted images of the biliary and pancreatic ducts were obtained, and three-dimensional MRCP images were rendered by post  processing. CONTRAST:  6mL GADAVIST GADOBUTROL 1 MMOL/ML IV SOLN COMPARISON:  None Available. FINDINGS: Lower chest: No acute findings. Hepatobiliary: Normal hepatic parenchymal echogenicity. No intrahepatic masses identified. No intra or extrahepatic biliary ductal dilation. The extrahepatic bile duct measures 6-7 mm proximally and tapers normally toward the ampulla. No intraluminal filling defect identified to suggest choledocholithiasis. Normal biliary anatomy. Cholelithiasis noted without superimposed pericholecystic inflammatory change. Pancreas: No mass, inflammatory changes, or other parenchymal abnormality identified. No pancreatic divisum Spleen:  Within normal limits in size and appearance. Adrenals/Urinary Tract: The adrenal glands are unremarkable. The kidneys are normal in size and position. Tiny simple cortical cyst noted within the lower pole the left kidney  for which no follow-up imaging is recommended. The kidneys are otherwise unremarkable. Stomach/Bowel: Visualized portions within the abdomen are unremarkable. Vascular/Lymphatic: No pathologically enlarged lymph nodes identified. No abdominal aortic aneurysm demonstrated. Other:  None. Musculoskeletal: No suspicious bone lesions identified. IMPRESSION: 1. Cholelithiasis without evidence for choledocholithiasis or biliary ductal dilation. Electronically Signed   By: Helyn Numbers M.D.   On: 02/21/2023 23:49   MR 3D Recon At Scanner  Result Date: 02/21/2023 CLINICAL DATA:  Cholelithiasis, biliary ductal dilation EXAM: MRI ABDOMEN WITHOUT AND WITH CONTRAST (INCLUDING MRCP) TECHNIQUE: Multiplanar multisequence MR imaging of the abdomen was performed both before and after the administration of intravenous contrast. Heavily T2-weighted images of the biliary and pancreatic ducts were obtained, and three-dimensional MRCP images were rendered by post processing. CONTRAST:  6mL GADAVIST GADOBUTROL 1 MMOL/ML IV SOLN COMPARISON:  None Available.  FINDINGS: Lower chest: No acute findings. Hepatobiliary: Normal hepatic parenchymal echogenicity. No intrahepatic masses identified. No intra or extrahepatic biliary ductal dilation. The extrahepatic bile duct measures 6-7 mm proximally and tapers normally toward the ampulla. No intraluminal filling defect identified to suggest choledocholithiasis. Normal biliary anatomy. Cholelithiasis noted without superimposed pericholecystic inflammatory change. Pancreas: No mass, inflammatory changes, or other parenchymal abnormality identified. No pancreatic divisum Spleen:  Within normal limits in size and appearance. Adrenals/Urinary Tract: The adrenal glands are unremarkable. The kidneys are normal in size and position. Tiny simple cortical cyst noted within the lower pole the left kidney for which no follow-up imaging is recommended. The kidneys are otherwise unremarkable. Stomach/Bowel: Visualized portions within the abdomen are unremarkable. Vascular/Lymphatic: No pathologically enlarged lymph nodes identified. No abdominal aortic aneurysm demonstrated. Other:  None. Musculoskeletal: No suspicious bone lesions identified. IMPRESSION: 1. Cholelithiasis without evidence for choledocholithiasis or biliary ductal dilation. Electronically Signed   By: Helyn Numbers M.D.   On: 02/21/2023 23:49   US Abdomen Limited RUQ (LIVER/GB)  Result Date: 02/21/2023 CLINICAL DATA:  Right upper quadrant abdominal EXAM: ULTRASOUND ABDOMEN LIMITED RIGHT UPPER QUADRANT COMPARISON:  None Available. FINDINGS: Gallbladder: Cholelithiasis. No gallbladder wall thickening. No sonographic Murphy sign noted by sonographer. Common bile duct: Diameter: 8 mm Liver: No focal lesion identified. Mild intrahepatic bile duct dilation. Portal vein is patent on color Doppler imaging with normal direction of blood flow towards the liver. Other: None. IMPRESSION: 1. Cholelithiasis without sonographic evidence of acute cholecystitis. 2. Mild intrahepatic and  extrahepatic bile duct dilation. Recommend correlation with liver function tests. Electronically Signed   By: Agustin Cree M.D.   On: 02/21/2023 18:26      Impression / Plan:   Tammy Frey is a 37 y.o. y/o female presented to the emergency room with 1 day history of multiple episodes of vomiting epigastric right upper quadrant pain.  This began after a meal at [facility] and drinking large quantities of alcohol.  In the emergency room her LFTs were significantly elevated with transaminases in the thousands INR was 1.1.  Total bilirubin was elevated.  The liver function tests have significantly improved overnight.  The MRCP did not show any obstruction but showed cholelithiasis.  Differentials for her presentation include ischemic hepatitis related to dehydration from multiple episodes of vomiting.  The multiple episodes of vomiting in turn could be related to an episode of food poisoning or could be related to passage of a gallstone through her common bile duct.  This is particularly possible with a history of epigastric and right upper quadrant pain.  Plan 1.  Check serum Tylenol levels as it was  not checked on admission 2.  Follow hepatitis panel acute 3.  Rehydrate as I would expect the liver function test to improve rapidly if this is from ischemic hepatitis and no further specific treatment needs to be provided. 4.  Consider surgical consult to determine if her gallbladder needs to be taken out if this is symptomatic biliary colic and they do feel that she might have passed a gallstone.     Thank you for involving me in the care of this patient.      LOS: 0 days   [name], MD  02/22/2023, 9:03 AM

## 2023-02-22 NOTE — Assessment & Plan Note (Signed)
Treated with empiric Rocephin during admissin. Culture with multiple organisms, likely contaminated.  Pt asymptomatic.  No antibiotics indicated at d/c. PCP follow up.

## 2023-02-22 NOTE — Assessment & Plan Note (Addendum)
Symptoms improved with supportive care. GI and surgery were consulted. Follow up in surgery clinic next week. Abstain from cocaine as surgery will not be done on elective basis with +cocaine. Zofran PRN Rx sent.

## 2023-02-22 NOTE — H&P (Addendum)
[place]   PATIENT NAME: Tammy Frey    MR#:  [id number]  DATE OF BIRTH:  March 14, 1986  DATE OF ADMISSION:  02/21/2023  PRIMARY CARE PHYSICIAN: Natale Milch, MD   Patient is coming from: Home  REQUESTING/REFERRING PHYSICIAN: Chiquita Loth, MD  CHIEF COMPLAINT:   Chief Complaint  Patient presents with   Emesis    X 10 hours    HISTORY OF PRESENT ILLNESS:  Tammy Frey is a 37 y.o. African-American female with medical history significant for seizures and anemia, who presented to the emergency room with acute onset of right upper quadrant abdominal pain with associated nausea and vomiting.  She went out last night and had 3 shots at [facility] and ate there after which she started having recurrent nausea and vomiting.  She denies any bilious vomitus or hematemesis.  No diarrhea or melena or bright red bleeding per rectum.  When her symptoms persisted she came to the ER.  No fever or chills.  No chest pain or palpitations.  No headache or dizziness or blurred vision.  ED Course: Upon presentation to the emergency room, BP was 142/92 with heart rate of 101 with otherwise normal vitals signs.  Labs revealed elevated LFTs with AST of 1315, ALT of 455 and total protein 8.2 with total bili 2.7 with normal alk phos of 119 and normal lipase of 25.  Urine pregnancy test was negative.  INR was 1.1 and PT 14.  UA came back positive. EKG as reviewed by me : Pending.  Imaging:Right upper quadrant ultrasound revealed cholelithiasis without sonographic evidence of acute cholecystitis.  It showed mild intrahepatic and extrapelvic biliary ductal dilatation with recommendation of correlation with LFTs.  Common bile duct diameter was 8 mm.  MRCP revealed cholelithiasis without evidence of choledocholithiasis or biliary ductal dilatation.  The patient was given a total of 6 mg of IV morphine sulfate, 4 mg IV Zofran, 1 L bolus of IV normal saline was ordered IV Zosyn.  Dr. Tobi Bastos  was notified about the patient and is aware.  She will be admitted to a medical telemetry bed for further evaluation and management. PAST MEDICAL HISTORY:   Past Medical History:  Diagnosis Date   Anemia    IDA (iron deficiency anemia) 09/10/2020   Seizures (HCC)     PAST SURGICAL HISTORY:   Past Surgical History:  Procedure Laterality Date   CESAREAN SECTION WITH BILATERAL TUBAL LIGATION  10/13/2020   Procedure: CESAREAN SECTION WITH BILATERAL TUBAL LIGATION;  Surgeon: Nadara Mustard, MD;  Location: [facility] ORS;  Service: Obstetrics;;    SOCIAL HISTORY:   Social History   Tobacco Use   Smoking status: Every Day    Years: 20    Types: Cigarettes   Smokeless tobacco: Never   Tobacco comments:    3 cigarettes all day   Substance Use Topics   Alcohol use: Not Currently    Comment: occasionally- before preganancy     FAMILY HISTORY:   Family History  Problem Relation Age of Onset   Diabetes Father    Breast cancer Paternal Aunt    Lung cancer Paternal Uncle    Cancer Paternal Grandfather     DRUG ALLERGIES:   Allergies  Allergen Reactions   Haldol [Haloperidol]     Dystonic reaction    REVIEW OF SYSTEMS:   ROS As per history of present illness. All pertinent systems were reviewed above. Constitutional, HEENT, cardiovascular, respiratory, GI, GU, musculoskeletal, neuro,  psychiatric, endocrine, integumentary and hematologic systems were reviewed and are otherwise negative/unremarkable except for positive findings mentioned above in the HPI.  MEDICATIONS AT HOME:   Prior to Admission medications   Medication Sig Start Date End Date Taking? Authorizing Provider  acetaminophen (TYLENOL) 325 MG tablet Take 2 tablets (650 mg total) by mouth every 4 (four) hours as needed for mild pain (temperature > 101.5.). 10/15/20   Zipporah Plants, CNM  docusate sodium (COLACE) 100 MG capsule Take 100 mg by mouth daily.    [provider]  ferrous sulfate 325 (65 FE) MG EC  tablet Take 325 mg by mouth daily with breakfast.    [provider]      VITAL SIGNS:  Blood pressure 121/67, pulse 61, temperature 98.1 F (36.7 C), resp. rate 18, height 5\' 7"  (1.702 m), weight 63.5 kg, SpO2 100 %, unknown if currently breastfeeding.  PHYSICAL EXAMINATION:  Physical Exam  GENERAL:  37 y.o.-year-old African-American female patient lying in the bed with no acute distress.  EYES: Pupils equal, round, reactive to light and accommodation. No scleral icterus. Extraocular muscles intact.  HEENT: Head atraumatic, normocephalic. Oropharynx and nasopharynx clear.  NECK:  Supple, no jugular venous distention. No thyroid enlargement, no tenderness.  LUNGS: Normal breath sounds bilaterally, no wheezing, rales,rhonchi or crepitation. No use of accessory muscles of respiration.  CARDIOVASCULAR: Regular rate and rhythm, S1, S2 normal. No murmurs, rubs, or gallops.  ABDOMEN: Soft, nondistended, with epigastric and right upper quadrant tenderness without rebound tenderness guarding or rigidity.  She had a weakly positive Murphy sign.  Bowel sounds present. No organomegaly or mass.  EXTREMITIES: No pedal edema, cyanosis, or clubbing.  NEUROLOGIC: Cranial nerves II through XII are intact. Muscle strength 5/5 in all extremities. Sensation intact. Gait not checked.  PSYCHIATRIC: The patient is alert and oriented x 3.  Normal affect and good eye contact. SKIN: No obvious rash, lesion, or ulcer.   LABORATORY PANEL:   CBC Recent Labs  Lab 02/21/23 1521  WBC 4.4  HGB 13.3  HCT 40.4  PLT 209   ------------------------------------------------------------------------------------------------------------------  Chemistries  Recent Labs  Lab 02/21/23 1521  NA 137  K 4.1  CL 102  CO2 27  GLUCOSE 112*  BUN 16  CREATININE 0.78  CALCIUM 9.3  AST 1,315*  ALT 455*  ALKPHOS 119  BILITOT 2.7*    ------------------------------------------------------------------------------------------------------------------  Cardiac Enzymes No results for input(s): "TROPONINI" in the last 168 hours. ------------------------------------------------------------------------------------------------------------------  RADIOLOGY:  MR ABDOMEN MRCP W WO CONTAST  Result Date: 02/21/2023 CLINICAL DATA:  Cholelithiasis, biliary ductal dilation EXAM: MRI ABDOMEN WITHOUT AND WITH CONTRAST (INCLUDING MRCP) TECHNIQUE: Multiplanar multisequence MR imaging of the abdomen was performed both before and after the administration of intravenous contrast. Heavily T2-weighted images of the biliary and pancreatic ducts were obtained, and three-dimensional MRCP images were rendered by post processing. CONTRAST:  6mL GADAVIST GADOBUTROL 1 MMOL/ML IV SOLN COMPARISON:  None Available. FINDINGS: Lower chest: No acute findings. Hepatobiliary: Normal hepatic parenchymal echogenicity. No intrahepatic masses identified. No intra or extrahepatic biliary ductal dilation. The extrahepatic bile duct measures 6-7 mm proximally and tapers normally toward the ampulla. No intraluminal filling defect identified to suggest choledocholithiasis. Normal biliary anatomy. Cholelithiasis noted without superimposed pericholecystic inflammatory change. Pancreas: No mass, inflammatory changes, or other parenchymal abnormality identified. No pancreatic divisum Spleen:  Within normal limits in size and appearance. Adrenals/Urinary Tract: The adrenal glands are unremarkable. The kidneys are normal in size and position. Tiny simple cortical cyst noted within the  lower pole the left kidney for which no follow-up imaging is recommended. The kidneys are otherwise unremarkable. Stomach/Bowel: Visualized portions within the abdomen are unremarkable. Vascular/Lymphatic: No pathologically enlarged lymph nodes identified. No abdominal aortic aneurysm demonstrated. Other:   None. Musculoskeletal: No suspicious bone lesions identified. IMPRESSION: 1. Cholelithiasis without evidence for choledocholithiasis or biliary ductal dilation. Electronically Signed   By: Helyn Numbers M.D.   On: 02/21/2023 23:49   MR 3D Recon At Scanner  Result Date: 02/21/2023 CLINICAL DATA:  Cholelithiasis, biliary ductal dilation EXAM: MRI ABDOMEN WITHOUT AND WITH CONTRAST (INCLUDING MRCP) TECHNIQUE: Multiplanar multisequence MR imaging of the abdomen was performed both before and after the administration of intravenous contrast. Heavily T2-weighted images of the biliary and pancreatic ducts were obtained, and three-dimensional MRCP images were rendered by post processing. CONTRAST:  6mL GADAVIST GADOBUTROL 1 MMOL/ML IV SOLN COMPARISON:  None Available. FINDINGS: Lower chest: No acute findings. Hepatobiliary: Normal hepatic parenchymal echogenicity. No intrahepatic masses identified. No intra or extrahepatic biliary ductal dilation. The extrahepatic bile duct measures 6-7 mm proximally and tapers normally toward the ampulla. No intraluminal filling defect identified to suggest choledocholithiasis. Normal biliary anatomy. Cholelithiasis noted without superimposed pericholecystic inflammatory change. Pancreas: No mass, inflammatory changes, or other parenchymal abnormality identified. No pancreatic divisum Spleen:  Within normal limits in size and appearance. Adrenals/Urinary Tract: The adrenal glands are unremarkable. The kidneys are normal in size and position. Tiny simple cortical cyst noted within the lower pole the left kidney for which no follow-up imaging is recommended. The kidneys are otherwise unremarkable. Stomach/Bowel: Visualized portions within the abdomen are unremarkable. Vascular/Lymphatic: No pathologically enlarged lymph nodes identified. No abdominal aortic aneurysm demonstrated. Other:  None. Musculoskeletal: No suspicious bone lesions identified. IMPRESSION: 1. Cholelithiasis without  evidence for choledocholithiasis or biliary ductal dilation. Electronically Signed   By: Helyn Numbers M.D.   On: 02/21/2023 23:49   US Abdomen Limited RUQ (LIVER/GB)  Result Date: 02/21/2023 CLINICAL DATA:  Right upper quadrant abdominal EXAM: ULTRASOUND ABDOMEN LIMITED RIGHT UPPER QUADRANT COMPARISON:  None Available. FINDINGS: Gallbladder: Cholelithiasis. No gallbladder wall thickening. No sonographic Murphy sign noted by sonographer. Common bile duct: Diameter: 8 mm Liver: No focal lesion identified. Mild intrahepatic bile duct dilation. Portal vein is patent on color Doppler imaging with normal direction of blood flow towards the liver. Other: None. IMPRESSION: 1. Cholelithiasis without sonographic evidence of acute cholecystitis. 2. Mild intrahepatic and extrahepatic bile duct dilation. Recommend correlation with liver function tests. Electronically Signed   By: Agustin Cree M.D.   On: 02/21/2023 18:26      IMPRESSION AND PLAN:  Assessment and Plan: * Symptomatic cholelithiasis - This is clearly symptomatic. She has subsequent intractable nausea and vomiting with epigastric and right upper quadrant abdominal pain - The patient will be admitted to a medical telemetry bed. - Pain management will be provided. - The patient was given IV Zosyn for 1 dose and will continue with IV Rocephin and Flagyl - General surgery consult will be obtained. - I notified Dr. Aleen Campi about the patient.  Elevated LFTs - This could be related to acute cholestatic and possibly alcohol hepatitis. - Will obtain acute viral hepatitis markers. - GI consult will be obtained. - Dr. Tobi Bastos was notified about the patient and is aware.  He excepted admission here especially with normal coag profile.  Acute lower UTI - We will place the patient on IV Rocephin and follow urine culture and sensitivity.   DVT prophylaxis: Lovenox.  Advanced Care Planning:  Code  Status: full code.  Family Communication:  The plan of care  was discussed in details with the patient (and family). I answered all questions. The patient agreed to proceed with the above mentioned plan. Further management will depend upon hospital course. Disposition Plan: Back to previous home environment Consults called: GI and general surgery. All the records are reviewed and case discussed with ED provider.  Status is: Inpatient   At the time of the admission, it appears that the appropriate admission status for this patient is inpatient.  This is judged to be reasonable and necessary in order to provide the required intensity of service to ensure the patient's safety given the presenting symptoms, physical exam findings and initial radiographic and laboratory data in the context of comorbid conditions.  The patient requires inpatient status due to high intensity of service, high risk of further deterioration and high frequency of surveillance required.  I certify that at the time of admission, it is my clinical judgment that the patient will require inpatient hospital care extending more than 2 midnights.                            Dispo: The patient is from: Home              Anticipated d/c is to: Home              Patient currently is not medically stable to d/c.              Difficult to place patient: No  Hannah Beat M.D on 02/22/2023 at 3:17 AM  [facility]   From 7 PM-7 AM, contact night-coverage [email or web address]  CC: Primary care physician; Natale Milch, MD

## 2023-02-22 NOTE — Consult Note (Signed)
Date of Consultation:  02/22/2023  Requesting Physician:  Valente David, MD  Reason for Consultation:  Symptomatic cholelithiasis  History of Present Illness: Tammy Frey is a 37 y.o. female admitted last night with multiple episodes of nausea/vomiting with abdominal pain.  The patient reports that the pain was located in bilateral upper quadrants as well as in the lower abdomen.  The pain was radiating to her back going from the low back to the upper back.  The patient reports that on 02/20/2023, she went out and had 3 shots of liquor and then went to [facility] for late night food.  Then around 5 in the morning on 02/21/2023 she started vomiting and continued having emesis during the day.  She reports having chills right after each episode of emesis.  She presented to the emergency room this afternoon.  Her workup in the emergency room showed initially a white blood cell count of 4.4, with total bilirubin of 2.7, AST 1315, ALT 455.  Ultrasound the right upper quadrant showed cholelithiasis without any gallbladder wall thickening or pericholecystic fluid.  There was mild intrahepatic and extrahepatic bile duct dilation with the common bile duct of 8 mm.  MRCP was then obtained which showed cholelithiasis again without any evidence of choledocholithiasis.  Her common bile duct measures 6 to 7 mm without any filling defects.  GI was consulted, there was initial concern for possible hepatitis picture, and given a normal INR, the patient was admitted for further evaluation.  This morning, the patient reports that her pain is better controlled with morphine.  She still having intermittent pains going from right upper quadrant to left upper quadrant to lower abdomen.  Denies any further nausea.  Her total bilirubin is a little bit more elevated to 3.2 with a direct component of 1.5.  AST is down to 571, ALT 456.  White blood cell count is down to 2.9 with hemoglobin 10.7.  Hepatitis panel is currently  pending.  Past Medical History: Past Medical History:  Diagnosis Date   Anemia    IDA (iron deficiency anemia) 09/10/2020   Seizures (HCC)      Past Surgical History: Past Surgical History:  Procedure Laterality Date   CESAREAN SECTION WITH BILATERAL TUBAL LIGATION  10/13/2020   Procedure: CESAREAN SECTION WITH BILATERAL TUBAL LIGATION;  Surgeon: Nadara Mustard, MD;  Location: [facility] ORS;  Service: Obstetrics;;    Home Medications: Prior to Admission medications   Medication Sig Start Date End Date Taking? Authorizing Provider  acetaminophen (TYLENOL) 325 MG tablet Take 2 tablets (650 mg total) by mouth every 4 (four) hours as needed for mild pain (temperature > 101.5.). 10/15/20   Zipporah Plants, CNM  docusate sodium (COLACE) 100 MG capsule Take 100 mg by mouth daily.    [provider]  ferrous sulfate 325 (65 FE) MG EC tablet Take 325 mg by mouth daily with breakfast.    [provider]    Allergies: Allergies  Allergen Reactions   Haldol [Haloperidol]     Dystonic reaction    Social History:  reports that she has been smoking. She has never used smokeless tobacco. She reports that she does not currently use alcohol. She reports current drug use. Drug: Marijuana.   Family History: Family History  Problem Relation Age of Onset   Diabetes Father    Breast cancer Paternal Aunt    Lung cancer Paternal Uncle    Cancer Paternal Grandfather     Review of Systems: Review  of Systems  Constitutional:  Positive for chills. Negative for fever.  HENT:  Negative for hearing loss.   Respiratory:  Negative for shortness of breath.   Cardiovascular:  Negative for chest pain.  Gastrointestinal:  Positive for abdominal pain, nausea and vomiting.  Genitourinary:  Negative for dysuria.  Musculoskeletal:  Positive for back pain.  Skin:  Negative for rash.  Neurological:  Negative for dizziness.  Psychiatric/Behavioral:  Negative for depression.     Physical  Exam BP 134/81   Pulse (!) 42   Temp 98.2 F (36.8 C)   Resp 11   Ht 5\' 7"  (1.702 m)   Wt 63.5 kg   SpO2 98%   BMI 21.93 kg/m  CONSTITUTIONAL: No acute distress HEENT:  Normocephalic, atraumatic, extraocular motion intact. NECK: Trachea is midline, and there is no jugular venous distension. RESPIRATORY:  Normal respiratory effort without pathologic use of accessory muscles. CARDIOVASCULAR: Regular rhythm and rate. GI: The abdomen is soft, nondistended, with some discomfort to palpation mostly now in the left lower quadrant.  Some soreness in the right upper quadrant, left upper quadrant, but negative Murphy's sign.  MUSCULOSKELETAL:  Normal muscle strength and tone in all four extremities.  No peripheral edema or cyanosis. SKIN: Skin turgor is normal. There are no pathologic skin lesions.  NEUROLOGIC:  Motor and sensation is grossly normal.  Cranial nerves are grossly intact. PSYCH:  Alert and oriented to person, place and time. Affect is normal.  Laboratory Analysis: Results for orders placed or performed during the hospital encounter of 02/21/23 (from the past 24 hour(s))  Lipase, blood     Status: None   Collection Time: 02/21/23  3:21 PM  Result Value Ref Range   Lipase 25 11 - 51 U/L  Comprehensive metabolic panel     Status: Abnormal   Collection Time: 02/21/23  3:21 PM  Result Value Ref Range   Sodium 137 135 - 145 mmol/L   Potassium 4.1 3.5 - 5.1 mmol/L   Chloride 102 98 - 111 mmol/L   CO2 27 22 - 32 mmol/L   Glucose, Bld 112 (H) 70 - 99 mg/dL   BUN 16 6 - 20 mg/dL   Creatinine, Ser 0.86 0.44 - 1.00 mg/dL   Calcium 9.3 8.9 - 57.8 mg/dL   Total Protein 8.2 (H) 6.5 - 8.1 g/dL   Albumin 4.7 3.5 - 5.0 g/dL   AST 4,696 (H) 15 - 41 U/L   ALT 455 (H) 0 - 44 U/L   Alkaline Phosphatase 119 38 - 126 U/L   Total Bilirubin 2.7 (H) 0.3 - 1.2 mg/dL   GFR, Estimated >29 >52 mL/min   Anion gap 8 5 - 15  CBC     Status: None   Collection Time: 02/21/23  3:21 PM  Result Value  Ref Range   WBC 4.4 4.0 - 10.5 K/uL   RBC 4.29 3.87 - 5.11 MIL/uL   Hemoglobin 13.3 12.0 - 15.0 g/dL   HCT 84.1 32.4 - 40.1 %   MCV 94.2 80.0 - 100.0 fL   MCH 31.0 26.0 - 34.0 pg   MCHC 32.9 30.0 - 36.0 g/dL   RDW 02.7 25.3 - 66.4 %   Platelets 209 150 - 400 K/uL   nRBC 0.0 0.0 - 0.2 %  Urinalysis, Routine w reflex microscopic -Urine, Clean Catch     Status: Abnormal   Collection Time: 02/21/23  3:22 PM  Result Value Ref Range   Color, Urine AMBER (A) YELLOW  APPearance HAZY (A) CLEAR   Specific Gravity, Urine 1.019 1.005 - 1.030   pH 8.0 5.0 - 8.0   Glucose, UA NEGATIVE NEGATIVE mg/dL   Hgb urine dipstick NEGATIVE NEGATIVE   Bilirubin Urine NEGATIVE NEGATIVE   Ketones, ur NEGATIVE NEGATIVE mg/dL   Protein, ur NEGATIVE NEGATIVE mg/dL   Nitrite NEGATIVE NEGATIVE   Leukocytes,Ua MODERATE (A) NEGATIVE   RBC / HPF 11-20 0 - 5 RBC/hpf   WBC, UA >50 0 - 5 WBC/hpf   Bacteria, UA RARE (A) NONE SEEN   Squamous Epithelial / HPF 11-20 0 - 5 /HPF   Mucus PRESENT   Urine Drug Screen, Qualitative ([facility] only)     Status: Abnormal   Collection Time: 02/21/23  3:22 PM  Result Value Ref Range   Tricyclic, Ur Screen NONE DETECTED NONE DETECTED   Amphetamines, Ur Screen NONE DETECTED NONE DETECTED   MDMA (Ecstasy)Ur Screen NONE DETECTED NONE DETECTED   Cocaine Metabolite,Ur [place] POSITIVE (A) NONE DETECTED   Opiate, Ur Screen NONE DETECTED NONE DETECTED   Phencyclidine (PCP) Ur S NONE DETECTED NONE DETECTED   Cannabinoid 50 Ng, Ur [place] POSITIVE (A) NONE DETECTED   Barbiturates, Ur Screen NONE DETECTED NONE DETECTED   Benzodiazepine, Ur Scrn NONE DETECTED NONE DETECTED   Methadone Scn, Ur NONE DETECTED NONE DETECTED  POC urine preg, ED     Status: None   Collection Time: 02/21/23  3:38 PM  Result Value Ref Range   Preg Test, Ur NEGATIVE NEGATIVE  Protime-INR     Status: None   Collection Time: 02/22/23 12:31 AM  Result Value Ref Range   Prothrombin Time 14.0 11.4 - 15.2 seconds   INR 1.1  0.8 - 1.2  HIV Antibody (routine testing w rflx)     Status: None   Collection Time: 02/22/23  5:33 AM  Result Value Ref Range   HIV Screen 4th Generation wRfx Non Reactive Non Reactive  Basic metabolic panel     Status: Abnormal   Collection Time: 02/22/23  5:33 AM  Result Value Ref Range   Sodium 137 135 - 145 mmol/L   Potassium 3.4 (L) 3.5 - 5.1 mmol/L   Chloride 107 98 - 111 mmol/L   CO2 26 22 - 32 mmol/L   Glucose, Bld 105 (H) 70 - 99 mg/dL   BUN 11 6 - 20 mg/dL   Creatinine, Ser 1.61 0.44 - 1.00 mg/dL   Calcium 8.4 (L) 8.9 - 10.3 mg/dL   GFR, Estimated >09 >60 mL/min   Anion gap 4 (L) 5 - 15  CBC     Status: Abnormal   Collection Time: 02/22/23  5:33 AM  Result Value Ref Range   WBC 2.9 (L) 4.0 - 10.5 K/uL   RBC 3.41 (L) 3.87 - 5.11 MIL/uL   Hemoglobin 10.7 (L) 12.0 - 15.0 g/dL   HCT 45.4 (L) 09.8 - 11.9 %   MCV 96.5 80.0 - 100.0 fL   MCH 31.4 26.0 - 34.0 pg   MCHC 32.5 30.0 - 36.0 g/dL   RDW 14.7 82.9 - 56.2 %   Platelets 156 150 - 400 K/uL   nRBC 0.0 0.0 - 0.2 %  Hepatic function panel     Status: Abnormal   Collection Time: 02/22/23  5:33 AM  Result Value Ref Range   Total Protein 6.4 (L) 6.5 - 8.1 g/dL   Albumin 3.5 3.5 - 5.0 g/dL   AST 130 (H) 15 - 41 U/L   ALT 456 (H)  0 - 44 U/L   Alkaline Phosphatase 102 38 - 126 U/L   Total Bilirubin 3.2 (H) 0.3 - 1.2 mg/dL   Bilirubin, Direct 1.5 (H) 0.0 - 0.2 mg/dL   Indirect Bilirubin 1.7 (H) 0.3 - 0.9 mg/dL  Hepatitis panel, acute     Status: None   Collection Time: 02/22/23  5:33 AM  Result Value Ref Range   Hepatitis B Surface Ag NON REACTIVE NON REACTIVE   HCV Ab NON REACTIVE NON REACTIVE   Hep A IgM NON REACTIVE NON REACTIVE   Hep B C IgM NON REACTIVE NON REACTIVE  Ethanol     Status: None   Collection Time: 02/22/23  5:33 AM  Result Value Ref Range   Alcohol, Ethyl (B) <10 <10 mg/dL    Imaging: MR ABDOMEN MRCP W WO CONTAST  Result Date: 02/21/2023 CLINICAL DATA:  Cholelithiasis, biliary ductal dilation  EXAM: MRI ABDOMEN WITHOUT AND WITH CONTRAST (INCLUDING MRCP) TECHNIQUE: Multiplanar multisequence MR imaging of the abdomen was performed both before and after the administration of intravenous contrast. Heavily T2-weighted images of the biliary and pancreatic ducts were obtained, and three-dimensional MRCP images were rendered by post processing. CONTRAST:  6mL GADAVIST GADOBUTROL 1 MMOL/ML IV SOLN COMPARISON:  None Available. FINDINGS: Lower chest: No acute findings. Hepatobiliary: Normal hepatic parenchymal echogenicity. No intrahepatic masses identified. No intra or extrahepatic biliary ductal dilation. The extrahepatic bile duct measures 6-7 mm proximally and tapers normally toward the ampulla. No intraluminal filling defect identified to suggest choledocholithiasis. Normal biliary anatomy. Cholelithiasis noted without superimposed pericholecystic inflammatory change. Pancreas: No mass, inflammatory changes, or other parenchymal abnormality identified. No pancreatic divisum Spleen:  Within normal limits in size and appearance. Adrenals/Urinary Tract: The adrenal glands are unremarkable. The kidneys are normal in size and position. Tiny simple cortical cyst noted within the lower pole the left kidney for which no follow-up imaging is recommended. The kidneys are otherwise unremarkable. Stomach/Bowel: Visualized portions within the abdomen are unremarkable. Vascular/Lymphatic: No pathologically enlarged lymph nodes identified. No abdominal aortic aneurysm demonstrated. Other:  None. Musculoskeletal: No suspicious bone lesions identified. IMPRESSION: 1. Cholelithiasis without evidence for choledocholithiasis or biliary ductal dilation. Electronically Signed   By: Helyn Numbers M.D.   On: 02/21/2023 23:49   MR 3D Recon At Scanner  Result Date: 02/21/2023 CLINICAL DATA:  Cholelithiasis, biliary ductal dilation EXAM: MRI ABDOMEN WITHOUT AND WITH CONTRAST (INCLUDING MRCP) TECHNIQUE: Multiplanar multisequence MR  imaging of the abdomen was performed both before and after the administration of intravenous contrast. Heavily T2-weighted images of the biliary and pancreatic ducts were obtained, and three-dimensional MRCP images were rendered by post processing. CONTRAST:  6mL GADAVIST GADOBUTROL 1 MMOL/ML IV SOLN COMPARISON:  None Available. FINDINGS: Lower chest: No acute findings. Hepatobiliary: Normal hepatic parenchymal echogenicity. No intrahepatic masses identified. No intra or extrahepatic biliary ductal dilation. The extrahepatic bile duct measures 6-7 mm proximally and tapers normally toward the ampulla. No intraluminal filling defect identified to suggest choledocholithiasis. Normal biliary anatomy. Cholelithiasis noted without superimposed pericholecystic inflammatory change. Pancreas: No mass, inflammatory changes, or other parenchymal abnormality identified. No pancreatic divisum Spleen:  Within normal limits in size and appearance. Adrenals/Urinary Tract: The adrenal glands are unremarkable. The kidneys are normal in size and position. Tiny simple cortical cyst noted within the lower pole the left kidney for which no follow-up imaging is recommended. The kidneys are otherwise unremarkable. Stomach/Bowel: Visualized portions within the abdomen are unremarkable. Vascular/Lymphatic: No pathologically enlarged lymph nodes identified. No abdominal aortic aneurysm demonstrated. Other:  None. Musculoskeletal: No suspicious bone lesions identified. IMPRESSION: 1. Cholelithiasis without evidence for choledocholithiasis or biliary ductal dilation. Electronically Signed   By: Helyn Numbers M.D.   On: 02/21/2023 23:49   US Abdomen Limited RUQ (LIVER/GB)  Result Date: 02/21/2023 CLINICAL DATA:  Right upper quadrant abdominal EXAM: ULTRASOUND ABDOMEN LIMITED RIGHT UPPER QUADRANT COMPARISON:  None Available. FINDINGS: Gallbladder: Cholelithiasis. No gallbladder wall thickening. No sonographic Murphy sign noted by  sonographer. Common bile duct: Diameter: 8 mm Liver: No focal lesion identified. Mild intrahepatic bile duct dilation. Portal vein is patent on color Doppler imaging with normal direction of blood flow towards the liver. Other: None. IMPRESSION: 1. Cholelithiasis without sonographic evidence of acute cholecystitis. 2. Mild intrahepatic and extrahepatic bile duct dilation. Recommend correlation with liver function tests. Electronically Signed   By: Agustin Cree M.D.   On: 02/21/2023 18:26    Assessment and Plan: This is a 37 y.o. female with cholelithiasis and elevated LFTs.  --Discussed with the patient the function of the gallbladder and the findings on her imaging studies and laboratory workup.  Discussed how sometimes LFTs can become elevated when a patient is having choledocholithiasis or passing a stone.  In her ultrasound and MRCP, her gallstones are large enough that would be difficult to pass them.  Her initial LFTs were also more elevated than is what is usual for choledocholithiasis, but more in hepatitis range.  Her pain is also alternating locations between RUQ, LUQ and lower abdomen and currently she's mostly tender in the LLQ.  Difficult to determine if she's truly having symptomatic cholelithiasis. --She's currently pending GI evaluation and hepatitis panel is pending.  At this point, no convincing evidence that this is truly symptomatic cholelithiasis.  Will defer to GI on her workup and if no other source, then will discuss further with patient about cholecystectomy.  I spent 45 minutes dedicated to the care of this patient on the date of this encounter to include pre-visit review of records, face-to-face time with the patient discussing diagnosis and management, and any post-visit coordination of care.   Howie Ill, MD [facility]:  [phone]

## 2023-02-22 NOTE — Progress Notes (Signed)
02/22/23  Case discussed with Dr. Tobi Bastos.  The patient has family history of cholelithiasis and other family members have had cholecystectomy.  Her pain started in epigastric area and moved to the RUQ.  Hepatitis panel was negative.  Although there is history pointing to cholelithiasis as the etiology for her pain, there is also history pointing to food poisoning or gastroenteritis.  Her urine drug screen was positive for cocaine, and given that she does not have cholecystitis, would be unable to do an elective surgery at this point.    May do po diet trial with low fat diet and if she tolerates, could follow as outpatient to discuss cholecystectomy.  Henrene Dodge, MD

## 2023-02-22 NOTE — ED Provider Notes (Signed)
-----------------------------------------   12:59 AM on 02/22/2023 -----------------------------------------   INR is normal.  Will consult hospitalist services for evaluation and admission.   Irean Hong, MD 02/22/23 [phone]

## 2023-02-22 NOTE — Assessment & Plan Note (Addendum)
Felt due passing a large gallstone +/- related to alcohol use.  Negative acute hepatitis panel. LFT's quickly trending down. --Repeat CMP in 1 week at follow up

## 2023-02-23 DIAGNOSIS — K8020 Calculus of gallbladder without cholecystitis without obstruction: Secondary | ICD-10-CM | POA: Diagnosis not present

## 2023-02-23 DIAGNOSIS — R7989 Other specified abnormal findings of blood chemistry: Secondary | ICD-10-CM | POA: Diagnosis not present

## 2023-02-23 LAB — COMPREHENSIVE METABOLIC PANEL
ALT: 257 U/L — ABNORMAL HIGH (ref 0–44)
AST: 142 U/L — ABNORMAL HIGH (ref 15–41)
Albumin: 3.6 g/dL (ref 3.5–5.0)
Alkaline Phosphatase: 81 U/L (ref 38–126)
Anion gap: 7 (ref 5–15)
BUN: 9 mg/dL (ref 6–20)
CO2: 24 mmol/L (ref 22–32)
Calcium: 8.5 mg/dL — ABNORMAL LOW (ref 8.9–10.3)
Chloride: 107 mmol/L (ref 98–111)
Creatinine, Ser: 0.79 mg/dL (ref 0.44–1.00)
GFR, Estimated: >60 mL/min (ref >60)
Glucose, Bld: 100 mg/dL — ABNORMAL HIGH (ref 70–99)
Potassium: 4.3 mmol/L (ref 3.5–5.1)
Sodium: 138 mmol/L (ref 135–145)
Total Bilirubin: 0.8 mg/dL (ref 0.3–1.2)
Total Protein: 6.7 g/dL (ref 6.5–8.1)

## 2023-02-23 LAB — URINE CULTURE

## 2023-02-23 MED ORDER — ONDANSETRON 8 MG PO TBDP: 8.0000 mg | ORAL_TABLET | Freq: Three times a day (TID) | ORAL | 0 refills | Status: AC | PRN

## 2023-02-23 NOTE — Progress Notes (Signed)
Discharge instructions reviewed with patient including followup visits and new medications.  Understanding was verbalized and all questions were answered.  IV removed without complication; patient tolerated well.  Patient has called ride and is on the way; states she needs to shower prior to leaving.

## 2023-02-23 NOTE — Discharge Summary (Signed)
Physician Discharge Summary   Patient: Tammy Frey MRN: [id number] DOB: 11/23/1985  Admit date:     02/21/2023  Discharge date: 02/23/2023  Discharge Physician: Pennie Banter   PCP: Natale Milch, MD   Recommendations at discharge:  {Tip this will not be part of the note when signed- Example include specific recommendations for outpatient follow-up, pending tests to follow-up on. (Optional):26781}  Follow up in surgery clinic next week. Repeat CBC, CMP in 1 week  Discharge Diagnoses: Principal Problem:   Symptomatic cholelithiasis Active Problems:   Elevated LFTs   Acute lower UTI  Resolved Problems:   * No resolved hospital problems. [facility] Course: No notes on file  Assessment and Plan: * Symptomatic cholelithiasis - This is clearly symptomatic. She has subsequent intractable nausea and vomiting with epigastric and right upper quadrant abdominal pain - The patient will be admitted to a medical telemetry bed. - Pain management will be provided. - The patient was given IV Zosyn for 1 dose and will continue with IV Rocephin and Flagyl - General surgery consult will be obtained. - I notified Dr. Aleen Campi about the patient.  Elevated LFTs - This could be related to acute cholestatic and possibly alcohol hepatitis. - Will obtain acute viral hepatitis markers. - GI consult will be obtained. - Dr. Tobi Bastos was notified about the patient and is aware.  He excepted admission here especially with normal coag profile.  Acute lower UTI - We will place the patient on IV Rocephin and follow urine culture and sensitivity.      {Tip this will not be part of the note when signed Body mass index is 21.93 kg/m. , ,  (Optional):26781}  {(NOTE) Pain control PDMP Statment (Optional):26782} Consultants: *** Procedures performed: ***  Disposition: {Plan; Disposition:26390} Diet recommendation:  {Diet_Plan:26776} DISCHARGE MEDICATION: Allergies as of 02/23/2023        Reactions   Haldol [haloperidol]    Dystonic reaction     Med Rec must be completed prior to using this SMARTLINK***       Follow-up Information     Piscoya, Elita Quick, MD Follow up in 1 week(s).   Specialty: General Surgery Contact information: [address] [place] [address] [phone]                Discharge Exam: Ceasar Mons Weights   02/21/23 1519  Weight: 63.5 kg   ***  Condition at discharge: {DC Condition:26389}  The results of significant diagnostics from this hospitalization (including imaging, microbiology, ancillary and laboratory) are listed below for reference.   Imaging Studies: MR ABDOMEN MRCP W WO CONTAST  Result Date: 02/21/2023 CLINICAL DATA:  Cholelithiasis, biliary ductal dilation EXAM: MRI ABDOMEN WITHOUT AND WITH CONTRAST (INCLUDING MRCP) TECHNIQUE: Multiplanar multisequence MR imaging of the abdomen was performed both before and after the administration of intravenous contrast. Heavily T2-weighted images of the biliary and pancreatic ducts were obtained, and three-dimensional MRCP images were rendered by post processing. CONTRAST:  6mL GADAVIST GADOBUTROL 1 MMOL/ML IV SOLN COMPARISON:  None Available. FINDINGS: Lower chest: No acute findings. Hepatobiliary: Normal hepatic parenchymal echogenicity. No intrahepatic masses identified. No intra or extrahepatic biliary ductal dilation. The extrahepatic bile duct measures 6-7 mm proximally and tapers normally toward the ampulla. No intraluminal filling defect identified to suggest choledocholithiasis. Normal biliary anatomy. Cholelithiasis noted without superimposed pericholecystic inflammatory change. Pancreas: No mass, inflammatory changes, or other parenchymal abnormality identified. No pancreatic divisum Spleen:  Within normal limits in size and appearance. Adrenals/Urinary Tract:  The adrenal glands are unremarkable. The kidneys are normal in size and position. Tiny simple cortical cyst  noted within the lower pole the left kidney for which no follow-up imaging is recommended. The kidneys are otherwise unremarkable. Stomach/Bowel: Visualized portions within the abdomen are unremarkable. Vascular/Lymphatic: No pathologically enlarged lymph nodes identified. No abdominal aortic aneurysm demonstrated. Other:  None. Musculoskeletal: No suspicious bone lesions identified. IMPRESSION: 1. Cholelithiasis without evidence for choledocholithiasis or biliary ductal dilation. Electronically Signed   By: Helyn Numbers M.D.   On: 02/21/2023 23:49   MR 3D Recon At Scanner  Result Date: 02/21/2023 CLINICAL DATA:  Cholelithiasis, biliary ductal dilation EXAM: MRI ABDOMEN WITHOUT AND WITH CONTRAST (INCLUDING MRCP) TECHNIQUE: Multiplanar multisequence MR imaging of the abdomen was performed both before and after the administration of intravenous contrast. Heavily T2-weighted images of the biliary and pancreatic ducts were obtained, and three-dimensional MRCP images were rendered by post processing. CONTRAST:  6mL GADAVIST GADOBUTROL 1 MMOL/ML IV SOLN COMPARISON:  None Available. FINDINGS: Lower chest: No acute findings. Hepatobiliary: Normal hepatic parenchymal echogenicity. No intrahepatic masses identified. No intra or extrahepatic biliary ductal dilation. The extrahepatic bile duct measures 6-7 mm proximally and tapers normally toward the ampulla. No intraluminal filling defect identified to suggest choledocholithiasis. Normal biliary anatomy. Cholelithiasis noted without superimposed pericholecystic inflammatory change. Pancreas: No mass, inflammatory changes, or other parenchymal abnormality identified. No pancreatic divisum Spleen:  Within normal limits in size and appearance. Adrenals/Urinary Tract: The adrenal glands are unremarkable. The kidneys are normal in size and position. Tiny simple cortical cyst noted within the lower pole the left kidney for which no follow-up imaging is recommended. The kidneys  are otherwise unremarkable. Stomach/Bowel: Visualized portions within the abdomen are unremarkable. Vascular/Lymphatic: No pathologically enlarged lymph nodes identified. No abdominal aortic aneurysm demonstrated. Other:  None. Musculoskeletal: No suspicious bone lesions identified. IMPRESSION: 1. Cholelithiasis without evidence for choledocholithiasis or biliary ductal dilation. Electronically Signed   By: Helyn Numbers M.D.   On: 02/21/2023 23:49   US Abdomen Limited RUQ (LIVER/GB)  Result Date: 02/21/2023 CLINICAL DATA:  Right upper quadrant abdominal EXAM: ULTRASOUND ABDOMEN LIMITED RIGHT UPPER QUADRANT COMPARISON:  None Available. FINDINGS: Gallbladder: Cholelithiasis. No gallbladder wall thickening. No sonographic Murphy sign noted by sonographer. Common bile duct: Diameter: 8 mm Liver: No focal lesion identified. Mild intrahepatic bile duct dilation. Portal vein is patent on color Doppler imaging with normal direction of blood flow towards the liver. Other: None. IMPRESSION: 1. Cholelithiasis without sonographic evidence of acute cholecystitis. 2. Mild intrahepatic and extrahepatic bile duct dilation. Recommend correlation with liver function tests. Electronically Signed   By: Agustin Cree M.D.   On: 02/21/2023 18:26    Microbiology: Results for orders placed or performed during the hospital encounter of 10/11/20  SARS CORONAVIRUS 2 (TAT 6-24 HRS) Nasopharyngeal Nasopharyngeal Swab     Status: None   Collection Time: 10/11/20 10:46 AM   Specimen: Nasopharyngeal Swab  Result Value Ref Range Status   SARS Coronavirus 2 NEGATIVE NEGATIVE Final    Comment: (NOTE) SARS-CoV-2 target nucleic acids are NOT DETECTED.  The SARS-CoV-2 RNA is generally detectable in upper and lower respiratory specimens during the acute phase of infection. Negative results do not preclude SARS-CoV-2 infection, do not rule out co-infections with other pathogens, and should not be used as the sole basis for treatment or  other patient management decisions. Negative results must be combined with clinical observations, patient history, and epidemiological information. The expected result is Negative.  Fact Sheet for Patients:  HairSlick.no  Fact Sheet for Healthcare Providers: quierodirigir.com  This test is not yet approved or cleared by the [place] FDA and  has been authorized for detection and/or diagnosis of SARS-CoV-2 by FDA under an Emergency Use Authorization (EUA). This EUA will remain  in effect (meaning this test can be used) for the duration of the COVID-19 declaration under Se ction 564(b)(1) of the Act, 21 U.S.C. section 360bbb-3(b)(1), unless the authorization is terminated or revoked sooner.  Performed at [facility], [address] [place], [place] [address]     Labs: CBC: Recent Labs  Lab 02/21/23 1521 02/22/23 0533  WBC 4.4 2.9*  HGB 13.3 10.7*  HCT 40.4 32.9*  MCV 94.2 96.5  PLT 209 156   Basic Metabolic Panel: Recent Labs  Lab 02/21/23 1521 02/22/23 0533 02/23/23 0511  NA 137 137 138  K 4.1 3.4* 4.3  CL 102 107 107  CO2 27 26 24   GLUCOSE 112* 105* 100*  BUN 16 11 9   CREATININE 0.78 0.77 0.79  CALCIUM 9.3 8.4* 8.5*   Liver Function Tests: Recent Labs  Lab 02/21/23 1521 02/22/23 0533 02/23/23 0511  AST 1,315* 571* 142*  ALT 455* 456* 257*  ALKPHOS 119 102 81  BILITOT 2.7* 3.2* 0.8  PROT 8.2* 6.4* 6.7  ALBUMIN 4.7 3.5 3.6   CBG: No results for input(s): "GLUCAP" in the last 168 hours.  Discharge time spent: {LESS THAN/GREATER AOZH:08657} 30 minutes.  Signed: Pennie Banter, DO [facility] 02/23/2023

## 2023-02-23 NOTE — Progress Notes (Signed)
02/23/2023  Subjective: No acute events overnight.  Patient was started on low fat diet yesterday.  No worsening pain.  LFTs continue to improve.  Vital signs: Temp:  [97.6 F (36.4 C)-98.8 F (37.1 C)] 98.8 F (37.1 C) (05/20 0800) Pulse Rate:  [42-57] 57 (05/20 0800) Resp:  [12-18] 18 (05/20 0800) BP: (122-166)/(61-97) 166/97 (05/20 0800) SpO2:  [100 %] 100 % (05/20 0800)   Intake/Output: 05/19 0701 - 05/20 0700 In: 1634.5 [P.O.:120; I.V.:1214.5; IV Piggyback:300] Out: -  Last BM Date : 02/20/23  Physical Exam: Constitutional: No acute distress Abdomen:, Nondistended, with some areas of soreness in the lower abdomen and mid abdomen.  Negative Murphy's sign.  Labs:  Recent Labs    02/21/23 1521 02/22/23 0533  WBC 4.4 2.9*  HGB 13.3 10.7*  HCT 40.4 32.9*  PLT 209 156   Recent Labs    02/22/23 0533 02/23/23 0511  NA 137 138  K 3.4* 4.3  CL 107 107  CO2 26 24  GLUCOSE 105* 100*  BUN 11 9  CREATININE 0.77 0.79  CALCIUM 8.4* 8.5*   Recent Labs    02/22/23 0031  LABPROT 14.0  INR 1.1    Imaging: No results found.  Assessment/Plan: This is a 37 y.o. female with cholelithiasis  -Discussed with patient that although it is not fully clear whether her episode was truly symptomatic cholelithiasis versus food poisoning as she does have history components of both, discussed with patient that given her family history also would be feasible to offer her cholecystectomy in the future.  Discussed with her again that given her urine drug screen that was positive for cocaine, will not be able to do this in this hospital admission. - Patient continue her low-fat diet.  Will give gallbladder diet instructions. - Will follow-up in office next week to start discussing cholecystectomy in the future.  Reminded the patient to abstain from drugs.   I spent 35 minutes dedicated to the care of this patient on the date of this encounter to include pre-visit review of records,  face-to-face time with the patient discussing diagnosis and management, and any post-visit coordination of care.  Howie Ill, MD [facility]

## 2023-02-23 NOTE — Plan of Care (Signed)
Resolving care plan, patient adequate for discharge.  Tammy Frey

## 2023-02-23 NOTE — Progress Notes (Signed)
Patient walked out with family in stable condition.  Cornell Barman Shontel Santee

## 2023-03-09 ENCOUNTER — Inpatient Hospital Stay: Payer: BLUE CROSS/BLUE SHIELD | Admitting: Surgery

## 2023-03-12 ENCOUNTER — Encounter: Payer: Self-pay | Admitting: Oncology

## 2023-03-12 ENCOUNTER — Telehealth: Payer: Self-pay | Admitting: Surgery

## 2023-03-12 ENCOUNTER — Ambulatory Visit: Payer: Self-pay | Admitting: Surgery

## 2023-03-12 ENCOUNTER — Encounter: Payer: Self-pay | Admitting: Surgery

## 2023-03-12 ENCOUNTER — Ambulatory Visit (INDEPENDENT_AMBULATORY_CARE_PROVIDER_SITE_OTHER): Payer: BLUE CROSS/BLUE SHIELD | Admitting: Surgery

## 2023-03-12 VITALS — BP 106/68 | HR 75 | Temp 98.0°F | Ht 67.0 in | Wt 131.0 lb

## 2023-03-12 DIAGNOSIS — K8010 Calculus of gallbladder with chronic cholecystitis without obstruction: Secondary | ICD-10-CM | POA: Insufficient documentation

## 2023-03-12 NOTE — Telephone Encounter (Signed)
Left message for patient to call, please inform her of the following regarding scheduled surgery with Dr. Claudine Mouton.   Pre-Admission date/time, and Surgery date at [facility].  Surgery Date: 03/18/23 Preadmission Testing Date: 03/13/23 (phone 1p-4p)  Also patient will need to call at [phone], between 1-3:00pm the day before surgery, to find out what time to arrive for surgery.

## 2023-03-12 NOTE — H&P (View-Only) (Signed)
Patient ID: Tammy Frey, female   DOB: 01/04/1986, 36 y.o.   MRN: [id number]  Chief Complaint: Right upper quadrant pain.  History of Present Illness Tammy Frey is a 36 y.o. female with hospitalization for hepatitis, having marked elevation of transaminases.  She was also noted to have gallstones at that time with mildly dilated common bile duct.  Underwent MRCP imaging.  She has since learned to avoid fatty and fried foods as [place] increases.  She has been extremely cautious with her diet but still has some residual right upper quadrant tenderness.  She knows that fatty foods produce vomiting, he also produces right upper quadrant pain which radiates to the back.  Exacerbated her current nausea.  She denies any jaundice or acholic stools  Past Medical History Past Medical History:  Diagnosis Date   Anemia    IDA (iron deficiency anemia) 09/10/2020   Seizures (HCC)       Past Surgical History:  Procedure Laterality Date   CESAREAN SECTION WITH BILATERAL TUBAL LIGATION  10/13/2020   Procedure: CESAREAN SECTION WITH BILATERAL TUBAL LIGATION;  Surgeon: Harris, Robert P, MD;  Location: [facility] ORS;  Service: Obstetrics;;    Allergies  Allergen Reactions   Haldol [Haloperidol]     Dystonic reaction    Current Outpatient Medications  Medication Sig Dispense Refill   acetaminophen (TYLENOL) 325 MG tablet Take 2 tablets (650 mg total) by mouth every 4 (four) hours as needed for mild pain (temperature > 101.5.). 30 tablet 2   docusate sodium (COLACE) 100 MG capsule Take 100 mg by mouth daily.     ferrous sulfate 325 (65 FE) MG EC tablet Take 325 mg by mouth daily with breakfast.     ondansetron (ZOFRAN-ODT) 8 MG disintegrating tablet Take 1 tablet (8 mg total) by mouth every 8 (eight) hours as needed for nausea or vomiting. 20 tablet 0   No current facility-administered medications for this visit.    Family History Family History  Problem Relation Age of Onset   Diabetes  Father    Breast cancer Paternal Aunt    Lung cancer Paternal Uncle    Cancer Paternal Grandfather       Social History Social History   Tobacco Use   Smoking status: Every Day    Packs/day: 0.50    Years: 20.00    Additional pack years: 0.00    Total pack years: 10.00    Types: Cigarettes    Passive exposure: Past   Smokeless tobacco: Never   Tobacco comments:    3 cigarettes all day   Vaping Use   Vaping Use: Former   Devices: used vaping to quit smoking but did not work   Substance Use Topics   Alcohol use: Yes    Comment: occasionally- before preganancy    Drug use: Yes    Types: Marijuana    Comment: last time she smoked was the week of 08/06/20        Review of Systems  Constitutional: Negative.   HENT: Negative.    Eyes: Negative.   Respiratory: Negative.    Cardiovascular: Negative.   Gastrointestinal:  Positive for abdominal pain, heartburn, nausea and vomiting. Negative for blood in stool, constipation, diarrhea and melena.  Genitourinary:  Positive for frequency.  Skin: Negative.   Neurological: Negative.   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  She is well aware that she cannot be using these drugs within extremity intensive   pain and generalized static, she reports she has not had any recently.).      Physical Exam Blood pressure 106/68, pulse 75, temperature 98 F (36.7 C), height 5' 7" (1.702 m), weight 131 lb (59.4 kg), SpO2 98 %, unknown if currently breastfeeding. Last Weight  Most recent update: 03/12/2023  8:48 AM    Weight  59.4 kg (131 lb)             CONSTITUTIONAL: Well developed, and nourished, appropriately responsive and aware without distress.   EYES: Sclera non-icteric.   EARS, NOSE, MOUTH AND THROAT:  The oropharynx is clear. Oral mucosa is pink and moist.     Hearing is intact to voice.  NECK: Trachea is midline, and there is no jugular venous distension.   LYMPH NODES:  Lymph nodes in the neck are not appreciated. RESPIRATORY:  Lungs are clear, and breath sounds are equal bilaterally.  Normal respiratory effort without pathologic use of accessory muscles. CARDIOVASCULAR: Heart is regular in rate and rhythm.   Well perfused.  GI: The abdomen is mild right upper quadrant tenderness, otherwise soft, nontender, and nondistended. There were no palpable masses.  I did not appreciate hepatosplenomegaly.  MUSCULOSKELETAL:  Symmetrical muscle tone appreciated in all four extremities.    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 SKIN: Skin turgor is normal. No pathologic skin lesions appreciated.  NEUROLOGIC:  Motor and sensation appear grossly normal.  Cranial nerves are grossly without defect. PSYCH:  Alert and oriented to person, place and time. Affect is appropriate for situation.  Data Reviewed I have personally reviewed what is currently available of the patient's imaging, recent labs and medical records.   Labs:     Latest Ref Rng & Units 02/22/2023    5:33 AM 02/21/2023    3:21 PM 10/24/2020   10:45 AM  CBC  WBC 4.0 - 10.5 K/uL 2.9  4.4  6.9   Hemoglobin 12.0 - 15.0 g/dL 10.7  13.3  10.4   Hematocrit 36.0 - 46.0 % 32.9  40.4  31.2   Platelets 150 - 400 K/uL 156  209  305       Latest Ref Rng & Units 02/23/2023    5:11 AM 02/22/2023    5:33 AM 02/21/2023    3:21 PM  CMP  Glucose 70 - 99 mg/dL 100  105  112   BUN 6 - 20 mg/dL 9  11  16   Creatinine 0.44 - 1.00 mg/dL 0.79  0.77  0.78   Sodium 135 - 145 mmol/L 138  137  137   Potassium 3.5 - 5.1 mmol/L 4.3  3.4  4.1   Chloride 98 - 111 mmol/L 107  107  102   CO2 22 - 32 mmol/L 24  26  27   Calcium 8.9 - 10.3 mg/dL 8.5  8.4  9.3   Total Protein 6.5 - 8.1 g/dL 6.7  6.4  8.2   Total Bilirubin 0.3 - 1.2 mg/dL 0.8  3.2  2.7   Alkaline Phos 38 - 126 U/L 81  102  119   AST 15 - 41 U/L 142  571  1,315   ALT 0 - 44 U/L 257  456  455       Imaging: Radiological images reviewed:  CLINICAL DATA:  Cholelithiasis, biliary ductal dilation   EXAM: MRI ABDOMEN WITHOUT AND WITH CONTRAST (INCLUDING MRCP)   TECHNIQUE: Multiplanar multisequence MR imaging of the abdomen was performed   both before and after the administration of intravenous contrast. Heavily T2-weighted images of the biliary and pancreatic ducts were obtained, and three-dimensional MRCP images were rendered by post processing.   CONTRAST:  6mL GADAVIST GADOBUTROL 1 MMOL/ML IV SOLN   COMPARISON:  None Available.   FINDINGS: Lower chest: No acute findings.   Hepatobiliary: Normal hepatic parenchymal echogenicity. No intrahepatic masses identified. No intra or extrahepatic biliary ductal dilation. The extrahepatic bile duct measures 6-7 mm proximally and tapers normally toward the ampulla. No intraluminal filling defect identified to suggest choledocholithiasis. Normal biliary anatomy. Cholelithiasis noted without superimposed pericholecystic inflammatory change.   Pancreas: No mass, inflammatory changes, or other parenchymal abnormality identified. No pancreatic divisum   Spleen:  Within normal limits in size and appearance.   Adrenals/Urinary Tract: The adrenal glands are unremarkable. The kidneys are normal in size and position. Tiny simple cortical cyst noted within the lower pole the left kidney for which no follow-up imaging is recommended. The kidneys are otherwise unremarkable.   Stomach/Bowel: Visualized portions within the abdomen are unremarkable.   Vascular/Lymphatic: No pathologically enlarged lymph nodes identified. No abdominal aortic aneurysm demonstrated.   Other:  None.   Musculoskeletal: No suspicious bone lesions identified.   IMPRESSION: 1. Cholelithiasis without evidence for choledocholithiasis or biliary ductal dilation.     Electronically Signed   By: Ashesh  Parikh M.D.   On: 02/21/2023 23:49 Within last 24 hrs:  No results found.  Assessment    Chronic calculus cholecystitis with biliary colic  Patient Active Problem List   Diagnosis Date Noted   Symptomatic cholelithiasis 02/22/2023   Elevated LFTs 02/22/2023   Acute lower UTI 02/22/2023   S/P cesarean section 10/23/2020   S/P tubal ligation 10/23/2020   Cesarean delivery, delivered, current hospitalization 10/15/2020   Pregnancy 10/13/2020   Fetal intolerance to labor, delivered, current hospitalization 10/13/2020   Admission for sterilization    IDA (iron deficiency anemia) 09/10/2020   Preterm contractions 09/04/2020   Uterine contractions 09/01/2020   Preterm uterine contractions in third trimester, antepartum    Supervision of other high risk pregnancies, third trimester 03/09/2020    Plan    Robotic cholecystectomy with ICG imaging.  This was discussed thoroughly.  Optimal plan is for robotic cholecystectomy utilizing ICG imaging. Risks and benefits have been discussed with the patient which include but are not limited to anesthesia, bleeding, infection, biliary ductal injury, resulting in leak or stenosis, other associated unanticipated injuries affiliated with laparoscopic surgery.   Reviewed that removing the gallbladder will only address the symptoms related to the gallbladder itself.  I believe there is the desire to proceed, accepting the risks with understanding.  Questions elicited and answered to satisfaction.    No guarantees ever expressed or implied.   Face-to-face time spent with the patient and accompanying care providers(if present) was 40 minutes, with more than 50% of the time spent counseling, educating, and coordinating care of the patient.    These notes generated with voice recognition software. I apologize for typographical errors.  Lylia Karn M.D., FACS 03/12/2023, 8:55 AM     

## 2023-03-12 NOTE — Patient Instructions (Addendum)
You have requested to have your gallbladder removed. This will be done at [facility] with Dr. Claudine Mouton. We are looking at doing this on June 12th.   You will most likely be out of work 1-2 weeks for this surgery.  If you have FMLA or disability paperwork that needs filled out you may drop this off at our office or this can be faxed to [phone].  You will return after your post-op appointment with a lifting restriction for approximately 3-4 more weeks.  You will be able to eat anything you would like to following surgery. But, start by eating a bland diet and advance this as tolerated. The Gallbladder diet is below, please go as closely by this diet as possible prior to surgery to avoid any further attacks.  Please see the (blue)pre-care form that you have been given today. Our surgery scheduler will call you to verify surgery date and to go over information.   If you have any questions, please call our office.  Laparoscopic Cholecystectomy Laparoscopic cholecystectomy is surgery to remove the gallbladder. The gallbladder is located in the upper right part of the abdomen, behind the liver. It is a storage sac for bile, which is produced in the liver. Bile aids in the digestion and absorption of fats. Cholecystectomy is often done for inflammation of the gallbladder (cholecystitis). This condition is usually caused by a buildup of gallstones (cholelithiasis) in the gallbladder. Gallstones can block the flow of bile, and that can result in inflammation and pain. In severe cases, emergency surgery may be required. If emergency surgery is not required, you will have time to prepare for the procedure. Laparoscopic surgery is an alternative to open surgery. Laparoscopic surgery has a shorter recovery time. Your common bile duct may also need to be examined during the procedure. If stones are found in the common bile duct, they may be removed. LET [facility] CARE PROVIDER KNOW ABOUT: Any  allergies you have. All medicines you are taking, including vitamins, herbs, eye drops, creams, and over-the-counter medicines. Previous problems you or members of your family have had with the use of anesthetics. Any blood disorders you have. Previous surgeries you have had.  Any medical conditions you have. RISKS AND COMPLICATIONS Generally, this is a safe procedure. However, problems may occur, including: Infection. Bleeding. Allergic reactions to medicines. Damage to other structures or organs. A stone remaining in the common bile duct. A bile leak from the cyst duct that is clipped when your gallbladder is removed. The need to convert to open surgery, which requires a larger incision in the abdomen. This may be necessary if your surgeon thinks that it is not safe to continue with a laparoscopic procedure. BEFORE THE PROCEDURE Ask your health care provider about: Changing or stopping your regular medicines. This is especially important if you are taking diabetes medicines or blood thinners. Taking medicines such as aspirin and ibuprofen. These medicines can thin your blood. Do not take these medicines before your procedure if your health care provider instructs you not to. Follow instructions from your health care provider about eating or drinking restrictions. Let your health care provider know if you develop a cold or an infection before surgery. Plan to have someone take you home after the procedure. Ask your health care provider how your surgical site will be marked or identified. You may be given antibiotic medicine to help prevent infection. PROCEDURE To reduce your risk of infection: Your health care team will wash or sanitize their  hands. Your skin will be washed with soap. An IV tube may be inserted into one of your veins. You will be given a medicine to make you fall asleep (general anesthetic). A breathing tube will be placed in your mouth. The surgeon will make several  small cuts (incisions) in your abdomen. A thin, lighted tube (laparoscope) that has a tiny camera on the end will be inserted through one of the small incisions. The camera on the laparoscope will send a picture to a TV screen (monitor) in the operating room. This will give the surgeon a good view inside your abdomen. A gas will be pumped into your abdomen. This will expand your abdomen to give the surgeon more room to perform the surgery. Other tools that are needed for the procedure will be inserted through the other incisions. The gallbladder will be removed through one of the incisions. After your gallbladder has been removed, the incisions will be closed with stitches (sutures), staples, or skin glue. Your incisions may be covered with a bandage (dressing). The procedure may vary among health care providers and hospitals. AFTER THE PROCEDURE Your blood pressure, heart rate, breathing rate, and blood oxygen level will be monitored often until the medicines you were given have worn off. You will be given medicines as needed to control your pain.   This information is not intended to replace advice given to you by your health care provider. Make sure you discuss any questions you have with your health care provider.   Document Released: 09/22/2005 Document Revised: 06/13/2015 Document Reviewed: 05/04/2013 Elsevier Interactive Patient Education 2016 Elsevier Inc.   Low-Fat Diet for Gallbladder Conditions A low-fat diet can be helpful if you have pancreatitis or a gallbladder condition. With these conditions, your pancreas and gallbladder have trouble digesting fats. A healthy eating plan with less fat will help rest your pancreas and gallbladder and reduce your symptoms. WHAT DO I NEED TO KNOW ABOUT THIS DIET? Eat a low-fat diet. Reduce your fat intake to less than 20-30% of your total daily calories. This is less than 50-60 g of fat per day. Remember that you need some fat in your diet. Ask  your dietician what your daily goal should be. Choose nonfat and low-fat healthy foods. Look for the words "nonfat," "low fat," or "fat free." As a guide, look on the label and choose foods with less than 3 g of fat per serving. Eat only one serving. Avoid alcohol. Do not smoke. If you need help quitting, talk with your health care provider. Eat small frequent meals instead of three large heavy meals. WHAT FOODS CAN I EAT? Grains Include healthy grains and starches such as potatoes, wheat bread, fiber-rich cereal, and brown rice. Choose whole grain options whenever possible. In adults, whole grains should account for 45-65% of your daily calories.  Fruits and Vegetables Eat plenty of fruits and vegetables. Fresh fruits and vegetables add fiber to your diet. Meats and Other Protein Sources Eat lean meat such as chicken and pork. Trim any fat off of meat before cooking it. Eggs, fish, and beans are other sources of protein. In adults, these foods should account for 10-35% of your daily calories. Dairy Choose low-fat milk and dairy options. Dairy includes fat and protein, as well as calcium.  Fats and Oils Limit high-fat foods such as fried foods, sweets, baked goods, sugary drinks.  Other Creamy sauces and condiments, such as mayonnaise, can add extra fat. Think about whether or not you need to  use them, or use smaller amounts or low fat options. WHAT FOODS ARE NOT RECOMMENDED? High fat foods, such as: [employer]. Ice cream. [place] toast. Sweet rolls. Pizza. Cheese bread. Foods covered with batter, butter, creamy sauces, or cheese. Fried foods. Sugary drinks and desserts. Foods that cause gas or bloating   This information is not intended to replace advice given to you by your health care provider. Make sure you discuss any questions you have with your health care provider.   Document Released: 09/27/2013 Document Reviewed: 09/27/2013 Elsevier Interactive Patient Education [employer].

## 2023-03-12 NOTE — Progress Notes (Signed)
Patient ID: Tammy Frey, female   DOB: [birth year], 37 y.o.   MRN: [id number]  Chief Complaint: Right upper quadrant pain.  History of Present Illness Tammy Frey is a 37 y.o. female with hospitalization for hepatitis, having marked elevation of transaminases.  She was also noted to have gallstones at that time with mildly dilated common bile duct.  Underwent MRCP imaging.  She has since learned to avoid fatty and fried foods as [place] increases.  She has been extremely cautious with her diet but still has some residual right upper quadrant tenderness.  She knows that fatty foods produce vomiting, he also produces right upper quadrant pain which radiates to the back.  Exacerbated her current nausea.  She denies any jaundice or acholic stools  Past Medical History Past Medical History:  Diagnosis Date   Anemia    IDA (iron deficiency anemia) 09/10/2020   Seizures (HCC)       Past Surgical History:  Procedure Laterality Date   CESAREAN SECTION WITH BILATERAL TUBAL LIGATION  10/13/2020   Procedure: CESAREAN SECTION WITH BILATERAL TUBAL LIGATION;  Surgeon: Nadara Mustard, MD;  Location: [facility] ORS;  Service: Obstetrics;;    Allergies  Allergen Reactions   Haldol [Haloperidol]     Dystonic reaction    Current Outpatient Medications  Medication Sig Dispense Refill   acetaminophen (TYLENOL) 325 MG tablet Take 2 tablets (650 mg total) by mouth every 4 (four) hours as needed for mild pain (temperature > 101.5.). 30 tablet 2   docusate sodium (COLACE) 100 MG capsule Take 100 mg by mouth daily.     ferrous sulfate 325 (65 FE) MG EC tablet Take 325 mg by mouth daily with breakfast.     ondansetron (ZOFRAN-ODT) 8 MG disintegrating tablet Take 1 tablet (8 mg total) by mouth every 8 (eight) hours as needed for nausea or vomiting. 20 tablet 0   No current facility-administered medications for this visit.    Family History Family History  Problem Relation Age of Onset   Diabetes  Father    Breast cancer Paternal Aunt    Lung cancer Paternal Uncle    Cancer Paternal Grandfather       Social History Social History   Tobacco Use   Smoking status: Every Day    Packs/day: 0.50    Years: 20.00    Additional pack years: 0.00    Total pack years: 10.00    Types: Cigarettes    Passive exposure: Past   Smokeless tobacco: Never   Tobacco comments:    3 cigarettes all day   Vaping Use   Vaping Use: Former   Devices: used vaping to quit smoking but did not work   Substance Use Topics   Alcohol use: Yes    Comment: occasionally- before preganancy    Drug use: Yes    Types: Marijuana    Comment: last time she smoked was the week of 08/06/20        Review of Systems  Constitutional: Negative.   HENT: Negative.    Eyes: Negative.   Respiratory: Negative.    Cardiovascular: Negative.   Gastrointestinal:  Positive for abdominal pain, heartburn, nausea and vomiting. Negative for blood in stool, constipation, diarrhea and melena.  Genitourinary:  Positive for frequency.  Skin: Negative.   Neurological: Negative.   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  She is well aware that she cannot be using these drugs within extremity intensive  pain and generalized static, she reports she has not had any recently.).      Physical Exam Blood pressure 106/68, pulse 75, temperature 98 F (36.7 C), height 5\' 7"  (1.702 m), weight 131 lb (59.4 kg), SpO2 98 %, unknown if currently breastfeeding. Last Weight  Most recent update: 03/12/2023  8:48 AM    Weight  59.4 kg (131 lb)             CONSTITUTIONAL: Well developed, and nourished, appropriately responsive and aware without distress.   EYES: Sclera non-icteric.   EARS, NOSE, MOUTH AND THROAT:  The oropharynx is clear. Oral mucosa is pink and moist.     Hearing is intact to voice.  NECK: Trachea is midline, and there is no jugular venous distension.   LYMPH NODES:  Lymph nodes in the neck are not appreciated. RESPIRATORY:  Lungs are clear, and breath sounds are equal bilaterally.  Normal respiratory effort without pathologic use of accessory muscles. CARDIOVASCULAR: Heart is regular in rate and rhythm.   Well perfused.  GI: The abdomen is mild right upper quadrant tenderness, otherwise soft, nontender, and nondistended. There were no palpable masses.  I did not appreciate hepatosplenomegaly.  MUSCULOSKELETAL:  Symmetrical muscle tone appreciated in all four extremities.    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 SKIN: Skin turgor is normal. No pathologic skin lesions appreciated.  NEUROLOGIC:  Motor and sensation appear grossly normal.  Cranial nerves are grossly without defect. PSYCH:  Alert and oriented to person, place and time. Affect is appropriate for situation.  Data Reviewed I have personally reviewed what is currently available of the patient's imaging, recent labs and medical records.   Labs:     Latest Ref Rng & Units 02/22/2023    5:33 AM 02/21/2023    3:21 PM 10/24/2020   10:45 AM  CBC  WBC 4.0 - 10.5 K/uL 2.9  4.4  6.9   Hemoglobin 12.0 - 15.0 g/dL 16.1  09.6  04.5   Hematocrit 36.0 - 46.0 % 32.9  40.4  31.2   Platelets 150 - 400 K/uL 156  209  305       Latest Ref Rng & Units 02/23/2023    5:11 AM 02/22/2023    5:33 AM 02/21/2023    3:21 PM  CMP  Glucose 70 - 99 mg/dL 409  811  914   BUN 6 - 20 mg/dL 9  11  16    Creatinine 0.44 - 1.00 mg/dL 7.82  9.56  2.13   Sodium 135 - 145 mmol/L 138  137  137   Potassium 3.5 - 5.1 mmol/L 4.3  3.4  4.1   Chloride 98 - 111 mmol/L 107  107  102   CO2 22 - 32 mmol/L 24  26  27    Calcium 8.9 - 10.3 mg/dL 8.5  8.4  9.3   Total Protein 6.5 - 8.1 g/dL 6.7  6.4  8.2   Total Bilirubin 0.3 - 1.2 mg/dL 0.8  3.2  2.7   Alkaline Phos 38 - 126 U/L 81  102  119   AST 15 - 41 U/L 142  571  1,315   ALT 0 - 44 U/L 257  456  455       Imaging: Radiological images reviewed:  CLINICAL DATA:  Cholelithiasis, biliary ductal dilation   EXAM: MRI ABDOMEN WITHOUT AND WITH CONTRAST (INCLUDING MRCP)   TECHNIQUE: Multiplanar multisequence MR imaging of the abdomen was performed  both before and after the administration of intravenous contrast. Heavily T2-weighted images of the biliary and pancreatic ducts were obtained, and three-dimensional MRCP images were rendered by post processing.   CONTRAST:  6mL GADAVIST GADOBUTROL 1 MMOL/ML IV SOLN   COMPARISON:  None Available.   FINDINGS: Lower chest: No acute findings.   Hepatobiliary: Normal hepatic parenchymal echogenicity. No intrahepatic masses identified. No intra or extrahepatic biliary ductal dilation. The extrahepatic bile duct measures 6-7 mm proximally and tapers normally toward the ampulla. No intraluminal filling defect identified to suggest choledocholithiasis. Normal biliary anatomy. Cholelithiasis noted without superimposed pericholecystic inflammatory change.   Pancreas: No mass, inflammatory changes, or other parenchymal abnormality identified. No pancreatic divisum   Spleen:  Within normal limits in size and appearance.   Adrenals/Urinary Tract: The adrenal glands are unremarkable. The kidneys are normal in size and position. Tiny simple cortical cyst noted within the lower pole the left kidney for which no follow-up imaging is recommended. The kidneys are otherwise unremarkable.   Stomach/Bowel: Visualized portions within the abdomen are unremarkable.   Vascular/Lymphatic: No pathologically enlarged lymph nodes identified. No abdominal aortic aneurysm demonstrated.   Other:  None.   Musculoskeletal: No suspicious bone lesions identified.   IMPRESSION: 1. Cholelithiasis without evidence for choledocholithiasis or biliary ductal dilation.     Electronically Signed   By: Helyn Numbers M.D.   On: 02/21/2023 23:49 Within last 24 hrs:  No results found.  Assessment    Chronic calculus cholecystitis with biliary colic  Patient Active Problem List   Diagnosis Date Noted   Symptomatic cholelithiasis 02/22/2023   Elevated LFTs 02/22/2023   Acute lower UTI 02/22/2023   S/P cesarean section 10/23/2020   S/P tubal ligation 10/23/2020   Cesarean delivery, delivered, current hospitalization 10/15/2020   Pregnancy 10/13/2020   Fetal intolerance to labor, delivered, current hospitalization 10/13/2020   Admission for sterilization    IDA (iron deficiency anemia) 09/10/2020   Preterm contractions 09/04/2020   Uterine contractions 09/01/2020   Preterm uterine contractions in third trimester, antepartum    Supervision of other high risk pregnancies, third trimester 03/09/2020    Plan    Robotic cholecystectomy with ICG imaging.  This was discussed thoroughly.  Optimal plan is for robotic cholecystectomy utilizing ICG imaging. Risks and benefits have been discussed with the patient which include but are not limited to anesthesia, bleeding, infection, biliary ductal injury, resulting in leak or stenosis, other associated unanticipated injuries affiliated with laparoscopic surgery.   Reviewed that removing the gallbladder will only address the symptoms related to the gallbladder itself.  I believe there is the desire to proceed, accepting the risks with understanding.  Questions elicited and answered to satisfaction.    No guarantees ever expressed or implied.   Face-to-face time spent with the patient and accompanying care providers(if present) was 40 minutes, with more than 50% of the time spent counseling, educating, and coordinating care of the patient.    These notes generated with voice recognition software. I apologize for typographical errors.  Campbell Lerner M.D., FACS 03/12/2023, 8:55 AM

## 2023-03-13 ENCOUNTER — Encounter
Admission: RE | Admit: 2023-03-13 | Discharge: 2023-03-13 | Disposition: A | Discharge status: Home / Self Care | Payer: Medicaid Other | Source: Ambulatory Visit | Attending: Surgery | Admitting: Surgery

## 2023-03-13 DIAGNOSIS — Z01818 Encounter for other preprocedural examination: Secondary | ICD-10-CM

## 2023-03-13 HISTORY — DX: Calculus of gallbladder with chronic cholecystitis without obstruction: K80.10

## 2023-03-13 HISTORY — DX: Tobacco use: Z72.0

## 2023-03-13 HISTORY — DX: Gastro-esophageal reflux disease without esophagitis: K21.9

## 2023-03-13 HISTORY — DX: Cocaine abuse, uncomplicated: F14.10

## 2023-03-13 HISTORY — DX: Other specified abnormal findings of blood chemistry: R79.89

## 2023-03-13 HISTORY — DX: Cannabis use, unspecified, uncomplicated: F12.90

## 2023-03-13 HISTORY — DX: Urinary tract infection, site not specified: N39.0

## 2023-03-13 NOTE — Telephone Encounter (Signed)
Patient calls back, she is now informed of all dates regarding her surgery. Her voice mailbox was full, which is why we couldn't get in touch with her. Explained the importance of being available today ad a nurse from preadmissions will be calling her today. She is given number for preadmit as well.  Patient verbalized understanding.

## 2023-03-13 NOTE — Patient Instructions (Signed)
Your procedure is scheduled on:03-18-23 Wednesday Report to the Registration Desk on the 1st floor of the [facility].Then proceed to the 2nd floor Surgery Desk To find out your arrival time, please call [phone] between 1PM - 3PM on:03-17-23 Tuesday If your arrival time is 6:00 am, do not arrive before that time as the [facility] entrance doors do not open until 6:00 am.  REMEMBER: Instructions that are not followed completely may result in serious medical risk, up to and including death; or upon the discretion of your surgeon and anesthesiologist your surgery may need to be rescheduled.  Do not eat food OR drink any liquids after midnight the night before surgery.  No gum chewing or hard candies.  One week prior to surgery:Last dose on 03-13-23 Stop Anti-inflammatories (NSAIDS) such as Advil, Aleve, Ibuprofen, Motrin, Naproxen, Naprosyn and Aspirin based products such as Excedrin, Goody's Powder, BC Powder.You may however, take Tylenol if needed for pain up until the day of surgery. Stop ANY OVER THE COUNTER supplements/vitamins NOW (03-13-23) until after surgery (Ok to continue iron pill)  TAKE ONLY THESE MEDICATIONS THE MORNING OF SURGERY WITH A SIP OF WATER: -You may take ondansetron (ZOFRAN-ODT) if needed for nausea  No Alcohol for 24 hours before or after surgery.  No Smoking including e-cigarettes for 24 hours before surgery.  No chewable tobacco products for at least 6 hours before surgery.  No nicotine patches on the day of surgery.  Do not use any "recreational" drugs for at least a week (preferably 2 weeks) before your surgery.  Please be advised that the combination of cocaine and anesthesia may have negative outcomes, up to and including death. If you test positive for cocaine, your surgery will be cancelled.  On the morning of surgery brush your teeth with toothpaste and water, you may rinse your mouth with mouthwash if you wish. Do not swallow any toothpaste or  mouthwash.  Use CHG Soap as directed on instruction sheet.  Do not wear jewelry, make-up, hairpins, clips or nail polish.  Do not wear lotions, powders, or perfumes.   Do not shave body hair from the neck down 48 hours before surgery.  Contact lenses, hearing aids and dentures may not be worn into surgery.  Do not bring valuables to the hospital. [facility] is not responsible for any missing/lost belongings or valuables. t.   Notify your doctor if there is any change in your medical condition (cold, fever, infection).  Wear comfortable clothing (specific to your surgery type) to the hospital.  After surgery, you can help prevent lung complications by doing breathing exercises.  Take deep breaths and cough every 1-2 hours. Your doctor may order a device called an Incentive Spirometer to help you take deep breaths. When coughing or sneezing, hold a pillow firmly against your incision with both hands. This is called "splinting." Doing this helps protect your incision. It also decreases belly discomfort.  If you are being admitted to the hospital overnight, leave your suitcase in the car. After surgery it may be brought to your room.  In case of increased patient census, it may be necessary for you, the patient, to continue your postoperative care in the Same Day Surgery department.  If you are being discharged the day of surgery, you will not be allowed to drive home. You will need a responsible individual to drive you home and stay with you for 24 hours after surgery.   If you are taking public transportation, you will need  to have a responsible individual with you.  Please call the Pre-admissions Testing Dept. at [phone] if you have any questions about these instructions.  Surgery Visitation Policy:  Patients having surgery or a procedure may have two visitors.  Children under the age of 66 must have an adult with them who is not the patient.     Preparing for Surgery  with CHLORHEXIDINE GLUCONATE (CHG) Soap  Chlorhexidine Gluconate (CHG) Soap  o An antiseptic cleaner that kills germs and bonds with the skin to continue killing germs even after washing  o Used for showering the night before surgery and morning of surgery  Before surgery, you can play an important role by reducing the number of germs on your skin.  CHG (Chlorhexidine gluconate) soap is an antiseptic cleanser which kills germs and bonds with the skin to continue killing germs even after washing.  Please do not use if you have an allergy to CHG or antibacterial soaps. If your skin becomes reddened/irritated stop using the CHG.  1. Shower the NIGHT BEFORE SURGERY and the MORNING OF SURGERY with CHG soap.  2. If you choose to wash your hair, wash your hair first as usual with your normal shampoo.  3. After shampooing, rinse your hair and body thoroughly to remove the shampoo.  4. Use CHG as you would any other liquid soap. You can apply CHG directly to the skin and wash gently with a scrungie or a clean washcloth.  5. Apply the CHG soap to your body only from the neck down. Do not use on open wounds or open sores. Avoid contact with your eyes, ears, mouth, and genitals (private parts). Wash face and genitals (private parts) with your normal soap.  6. Wash thoroughly, paying special attention to the area where your surgery will be performed.  7. Thoroughly rinse your body with warm water.  8. Do not shower/wash with your normal soap after using and rinsing off the CHG soap.  9. Pat yourself dry with a clean towel.  10. Wear clean pajamas to bed the night before surgery.  12. Place clean sheets on your bed the night of your first shower and do not sleep with pets.  13. Shower again with the CHG soap on the day of surgery prior to arriving at the hospital.  14. Do not apply any deodorants/lotions/powders.  15. Please wear clean clothes to the hospital.

## 2023-03-13 NOTE — Telephone Encounter (Signed)
Called patient again, not able to leave a message. Called mom's number, spoke with her. She said her daughter has been having trouble with her phone.  Explained to her the importance of her calling us regarding her scheduled surgery.  Mom will get in touch with her and have her to call us.

## 2023-03-18 ENCOUNTER — Ambulatory Visit: Payer: BLUE CROSS/BLUE SHIELD | Admitting: Anesthesiology

## 2023-03-18 ENCOUNTER — Encounter: Payer: Self-pay | Admitting: Oncology

## 2023-03-18 ENCOUNTER — Other Ambulatory Visit: Payer: Self-pay

## 2023-03-18 ENCOUNTER — Encounter: Payer: Self-pay | Admitting: Surgery

## 2023-03-18 ENCOUNTER — Encounter
Admission: RE | Disposition: A | Discharge status: Home / Self Care | Payer: Self-pay | Source: Home / Self Care | Attending: Surgery

## 2023-03-18 ENCOUNTER — Ambulatory Visit
Admission: RE | Admit: 2023-03-18 | Discharge: 2023-03-18 | Disposition: A | Discharge status: Home / Self Care | Payer: BLUE CROSS/BLUE SHIELD | Attending: Surgery | Admitting: Surgery

## 2023-03-18 DIAGNOSIS — K828 Other specified diseases of gallbladder: Secondary | ICD-10-CM | POA: Diagnosis not present

## 2023-03-18 DIAGNOSIS — Z01818 Encounter for other preprocedural examination: Secondary | ICD-10-CM

## 2023-03-18 DIAGNOSIS — K8010 Calculus of gallbladder with chronic cholecystitis without obstruction: Secondary | ICD-10-CM | POA: Insufficient documentation

## 2023-03-18 DIAGNOSIS — K219 Gastro-esophageal reflux disease without esophagitis: Secondary | ICD-10-CM | POA: Insufficient documentation

## 2023-03-18 DIAGNOSIS — D649 Anemia, unspecified: Secondary | ICD-10-CM | POA: Insufficient documentation

## 2023-03-18 DIAGNOSIS — R7989 Other specified abnormal findings of blood chemistry: Secondary | ICD-10-CM | POA: Insufficient documentation

## 2023-03-18 DIAGNOSIS — F17210 Nicotine dependence, cigarettes, uncomplicated: Secondary | ICD-10-CM | POA: Diagnosis not present

## 2023-03-18 LAB — POCT PREGNANCY, URINE: Preg Test, Ur: NEGATIVE

## 2023-03-18 SURGERY — CHOLECYSTECTOMY, ROBOT-ASSISTED, LAPAROSCOPIC
Anesthesia: General | Site: Abdomen

## 2023-03-18 MED ORDER — INDOCYANINE GREEN 25 MG IV SOLR
1.2500 mg | Freq: Once | INTRAVENOUS | Status: AC
  Administered 2023-03-18: 1.25 mg via INTRAVENOUS
  Filled 2023-03-18: qty 0.5

## 2023-03-18 MED ORDER — ACETAMINOPHEN 500 MG PO TABS
1000.0000 mg | ORAL_TABLET | ORAL | Status: AC
  Administered 2023-03-18: 1000 mg via ORAL

## 2023-03-18 MED ORDER — CHLORHEXIDINE GLUCONATE CLOTH 2 % EX PADS
6.0000 | MEDICATED_PAD | Freq: Once | CUTANEOUS | Status: AC
  Administered 2023-03-18: 6 via TOPICAL

## 2023-03-18 MED ORDER — ONDANSETRON HCL 4 MG PO TABS: 4.0000 mg | ORAL_TABLET | Freq: Three times a day (TID) | ORAL | 0 refills | Status: AC | PRN

## 2023-03-18 MED ORDER — BUPIVACAINE HCL (PF) 0.25 % IJ SOLN
INTRAMUSCULAR | Status: AC
  Filled 2023-03-18: qty 30

## 2023-03-18 MED ORDER — PROPOFOL 10 MG/ML IV BOLUS
INTRAVENOUS | Status: DC | PRN
  Administered 2023-03-18: 120 mg via INTRAVENOUS

## 2023-03-18 MED ORDER — CHLORHEXIDINE GLUCONATE 0.12 % MT SOLN
15.0000 mL | Freq: Once | OROMUCOSAL | Status: AC
  Administered 2023-03-18: 15 mL via OROMUCOSAL

## 2023-03-18 MED ORDER — FAMOTIDINE 20 MG PO TABS
20.0000 mg | ORAL_TABLET | Freq: Once | ORAL | Status: AC
  Administered 2023-03-18: 20 mg via ORAL

## 2023-03-18 MED ORDER — GABAPENTIN 300 MG PO CAPS
300.0000 mg | ORAL_CAPSULE | ORAL | Status: AC
  Administered 2023-03-18: 300 mg via ORAL

## 2023-03-18 MED ORDER — OXYCODONE HCL 5 MG PO TABS
ORAL_TABLET | ORAL | Status: AC
  Filled 2023-03-18: qty 1

## 2023-03-18 MED ORDER — SUGAMMADEX SODIUM 200 MG/2ML IV SOLN
INTRAVENOUS | Status: DC | PRN
  Administered 2023-03-18: 200 mg via INTRAVENOUS

## 2023-03-18 MED ORDER — OXYCODONE HCL 5 MG PO TABS
5.0000 mg | ORAL_TABLET | Freq: Four times a day (QID) | ORAL | 0 refills | Status: DC | PRN
Start: 2023-03-18 — End: 2023-04-02

## 2023-03-18 MED ORDER — LIDOCAINE HCL (CARDIAC) PF 100 MG/5ML IV SOSY
PREFILLED_SYRINGE | INTRAVENOUS | Status: DC | PRN
  Administered 2023-03-18: 60 mg via INTRAVENOUS

## 2023-03-18 MED ORDER — FENTANYL CITRATE (PF) 100 MCG/2ML IJ SOLN
INTRAMUSCULAR | Status: AC
  Filled 2023-03-18: qty 2

## 2023-03-18 MED ORDER — CELECOXIB 200 MG PO CAPS
200.0000 mg | ORAL_CAPSULE | ORAL | Status: AC
  Administered 2023-03-18: 200 mg via ORAL

## 2023-03-18 MED ORDER — CHLORHEXIDINE GLUCONATE CLOTH 2 % EX PADS: 6.0000 | MEDICATED_PAD | Freq: Once | CUTANEOUS | Status: AC

## 2023-03-18 MED ORDER — LACTATED RINGERS IV SOLN: INTRAVENOUS | Status: DC

## 2023-03-18 MED ORDER — PROPOFOL 10 MG/ML IV BOLUS
INTRAVENOUS | Status: AC
  Filled 2023-03-18: qty 20

## 2023-03-18 MED ORDER — CEFAZOLIN SODIUM-DEXTROSE 2-4 GM/100ML-% IV SOLN
INTRAVENOUS | Status: AC
  Filled 2023-03-18: qty 100

## 2023-03-18 MED ORDER — ACETAMINOPHEN 500 MG PO TABS
ORAL_TABLET | ORAL | Status: AC
  Filled 2023-03-18: qty 2

## 2023-03-18 MED ORDER — BUPIVACAINE LIPOSOME 1.3 % IJ SUSP: 20.0000 mL | Freq: Once | INTRAMUSCULAR | Status: DC

## 2023-03-18 MED ORDER — OXYCODONE HCL 5 MG/5ML PO SOLN: 5.0000 mg | Freq: Once | ORAL | Status: AC | PRN

## 2023-03-18 MED ORDER — CHLORHEXIDINE GLUCONATE 0.12 % MT SOLN
OROMUCOSAL | Status: AC
  Filled 2023-03-18: qty 15

## 2023-03-18 MED ORDER — SODIUM CHLORIDE 0.9 % IR SOLN
Status: DC | PRN
  Administered 2023-03-18: 3000 mL

## 2023-03-18 MED ORDER — MIDAZOLAM HCL 2 MG/2ML IJ SOLN
INTRAMUSCULAR | Status: DC | PRN
  Administered 2023-03-18: 2 mg via INTRAVENOUS

## 2023-03-18 MED ORDER — CEFAZOLIN SODIUM-DEXTROSE 2-4 GM/100ML-% IV SOLN
2.0000 g | INTRAVENOUS | Status: AC
  Administered 2023-03-18: 2 g via INTRAVENOUS

## 2023-03-18 MED ORDER — OXYCODONE HCL 5 MG PO TABS
5.0000 mg | ORAL_TABLET | Freq: Once | ORAL | Status: AC | PRN
  Administered 2023-03-18: 5 mg via ORAL

## 2023-03-18 MED ORDER — MIDAZOLAM HCL 2 MG/2ML IJ SOLN
INTRAMUSCULAR | Status: AC
  Filled 2023-03-18: qty 2

## 2023-03-18 MED ORDER — CELECOXIB 200 MG PO CAPS
ORAL_CAPSULE | ORAL | Status: AC
  Filled 2023-03-18: qty 1

## 2023-03-18 MED ORDER — ONDANSETRON HCL 4 MG/2ML IJ SOLN
INTRAMUSCULAR | Status: DC | PRN
  Administered 2023-03-18: 4 mg via INTRAVENOUS

## 2023-03-18 MED ORDER — FENTANYL CITRATE (PF) 100 MCG/2ML IJ SOLN
INTRAMUSCULAR | Status: DC | PRN
  Administered 2023-03-18: 50 ug via INTRAVENOUS
  Administered 2023-03-18: 100 ug via INTRAVENOUS
  Administered 2023-03-18: 50 ug via INTRAVENOUS

## 2023-03-18 MED ORDER — FAMOTIDINE 20 MG PO TABS
ORAL_TABLET | ORAL | Status: AC
  Filled 2023-03-18: qty 1

## 2023-03-18 MED ORDER — IBUPROFEN 800 MG PO TABS: 800.0000 mg | ORAL_TABLET | Freq: Three times a day (TID) | ORAL | 0 refills | Status: DC | PRN

## 2023-03-18 MED ORDER — DEXAMETHASONE SODIUM PHOSPHATE 10 MG/ML IJ SOLN
INTRAMUSCULAR | Status: DC | PRN
  Administered 2023-03-18: 10 mg via INTRAVENOUS

## 2023-03-18 MED ORDER — BUPIVACAINE-EPINEPHRINE (PF) 0.25% -1:200000 IJ SOLN
INTRAMUSCULAR | Status: DC | PRN
  Administered 2023-03-18: 30 mL

## 2023-03-18 MED ORDER — HYDROMORPHONE HCL 1 MG/ML IJ SOLN: 0.2500 mg | INTRAMUSCULAR | Status: DC | PRN

## 2023-03-18 MED ORDER — ORAL CARE MOUTH RINSE: 15.0000 mL | Freq: Once | OROMUCOSAL | Status: AC

## 2023-03-18 MED ORDER — EPINEPHRINE PF 1 MG/ML IJ SOLN
INTRAMUSCULAR | Status: AC
  Filled 2023-03-18: qty 1

## 2023-03-18 MED ORDER — INDOCYANINE GREEN 25 MG IV SOLR
INTRAVENOUS | Status: AC
  Filled 2023-03-18: qty 10

## 2023-03-18 MED ORDER — DEXMEDETOMIDINE HCL IN NACL 80 MCG/20ML IV SOLN
INTRAVENOUS | Status: DC | PRN
  Administered 2023-03-18 (×2): 8 ug via INTRAVENOUS

## 2023-03-18 MED ORDER — ONDANSETRON HCL 4 MG PO TABS: 4.0000 mg | ORAL_TABLET | Freq: Three times a day (TID) | ORAL | 0 refills | Status: DC | PRN

## 2023-03-18 MED ORDER — GABAPENTIN 300 MG PO CAPS
ORAL_CAPSULE | ORAL | Status: AC
  Filled 2023-03-18: qty 1

## 2023-03-18 MED ORDER — ROCURONIUM BROMIDE 100 MG/10ML IV SOLN
INTRAVENOUS | Status: DC | PRN
  Administered 2023-03-18: 40 mg via INTRAVENOUS

## 2023-03-18 SURGICAL SUPPLY — 38 items
ADH SKN CLS APL DERMABOND .7 (GAUZE/BANDAGES/DRESSINGS) ×1
CLIP LIGATING HEM O LOK PURPLE (MISCELLANEOUS) ×1 IMPLANT
COVER TIP SHEARS 8 DVNC (MISCELLANEOUS) ×1 IMPLANT
DERMABOND ADVANCED .7 DNX12 (GAUZE/BANDAGES/DRESSINGS) ×1 IMPLANT
DRAPE ARM DVNC X/XI (DISPOSABLE) ×4 IMPLANT
DRAPE COLUMN DVNC XI (DISPOSABLE) ×1 IMPLANT
ELECT CAUTERY BLADE 6.4 (BLADE) ×1 IMPLANT
FORCEPS BPLR R/ABLATION 8 DVNC (INSTRUMENTS) ×1 IMPLANT
FORCEPS PROGRASP DVNC XI (FORCEP) ×1 IMPLANT
GLOVE ORTHO TXT STRL SZ7.5 (GLOVE) ×2 IMPLANT
GOWN STRL REUS W/ TWL XL LVL3 (GOWN DISPOSABLE) ×2 IMPLANT
GOWN STRL REUS W/TWL XL LVL3 (GOWN DISPOSABLE) ×3
GRASPER SUT TROCAR 14GX15 (MISCELLANEOUS) ×1 IMPLANT
IRRIGATION STRYKERFLOW (MISCELLANEOUS) IMPLANT
IRRIGATOR STRYKERFLOW (MISCELLANEOUS) ×1
IV NS IRRIG 3000ML ARTHROMATIC (IV SOLUTION) IMPLANT
KIT PINK PAD W/HEAD ARE REST (MISCELLANEOUS) ×1
KIT PINK PAD W/HEAD ARM REST (MISCELLANEOUS) ×1 IMPLANT
LABEL OR SOLS (LABEL) ×1 IMPLANT
MANIFOLD NEPTUNE II (INSTRUMENTS) ×1 IMPLANT
NDL HYPO 22X1.5 SAFETY MO (MISCELLANEOUS) ×1 IMPLANT
NDL INSUFFLATION 14GA 120MM (NEEDLE) ×1 IMPLANT
NEEDLE HYPO 22X1.5 SAFETY MO (MISCELLANEOUS) ×1 IMPLANT
NEEDLE INSUFFLATION 14GA 120MM (NEEDLE) ×1 IMPLANT
NS IRRIG 500ML POUR BTL (IV SOLUTION) ×1 IMPLANT
PACK LAP CHOLECYSTECTOMY (MISCELLANEOUS) ×1 IMPLANT
SCISSORS MNPLR CVD DVNC XI (INSTRUMENTS) ×1 IMPLANT
SEAL UNIV 5-12 XI (MISCELLANEOUS) ×4 IMPLANT
SET TUBE SMOKE EVAC HIGH FLOW (TUBING) ×1 IMPLANT
SOL ELECTROSURG ANTI STICK (MISCELLANEOUS) ×1
SOLUTION ELECTROSURG ANTI STCK (MISCELLANEOUS) ×1 IMPLANT
SUT MNCRL 4-0 (SUTURE) ×1
SUT MNCRL 4-0 27XMFL (SUTURE) ×1
SUT VICRYL 0 UR6 27IN ABS (SUTURE) ×1 IMPLANT
SUTURE MNCRL 4-0 27XMF (SUTURE) ×1 IMPLANT
SYS BAG RETRIEVAL 10MM (BASKET) ×1
SYSTEM BAG RETRIEVAL 10MM (BASKET) ×1 IMPLANT
TROCAR Z-THREAD FIOS 11X100 BL (TROCAR) ×1 IMPLANT

## 2023-03-18 NOTE — Op Note (Signed)
Robotic cholecystectomy with Indocyamine Green Ductal Imaging.   Pre-operative Diagnosis: Chronic calculus cholecystitis  Post-operative Diagnosis:  Same.  Procedure: Robotic assisted laparoscopic cholecystectomy with Indocyamine Green Ductal Imaging.   Surgeon: Campbell Lerner, M.D., FACS  Anesthesia: General. with endotracheal tube  Findings: as expected.   Estimated Blood Loss: 10 mL         Drains: None         Specimens: Gallbladder           Complications: none  Procedure Details  The patient was seen again in the Holding Room.  1.25 mg dose of ICG was administered intravenously.   The benefits, complications, treatment options, risks and expected outcomes were again reviewed with the patient. The likelihood of improving the patient's symptoms with return to their baseline status is good.  The patient and/or family concurred with the proposed plan, giving informed consent, again alternatives reviewed.  The patient was taken to Operating Room, identified, and the procedure verified as robotic assisted laparoscopic cholecystectomy.  Prior to the induction of general anesthesia, antibiotic prophylaxis was administered. VTE prophylaxis was in place. General endotracheal anesthesia was then administered and tolerated well. The patient was positioned in the supine position.  After the induction, the abdomen was prepped with Chloraprep and draped in the sterile fashion.  A Time Out was held and the above information confirmed.  After local infiltration of quarter percent Marcaine with epinephrine, stab incision was made left upper quadrant.  Just below the costal margin at Palmer's point, approximately midclavicular line the Veres needle is passed with sensation of the layers to penetrate the abdominal wall and into the peritoneum.  Saline drop test is confirmed peritoneal placement.  Insufflation is initiated with carbon dioxide to pressures of 15 mmHg.  Right para-umbilical local  infiltration with quarter percent Marcaine with epinephrine is utilized.  Made a 12 mm incision on the right periumbilical site, I advanced an optical 11mm port under direct visualization into the peritoneal cavity.  Once the peritoneum was penetrated, insufflation was initiated.  The trocar was then advanced into the abdominal cavity under direct visualization. Pneumoperitoneum was then continued utilizing CO2 at 15 mmHg or less and tolerated well without any adverse changes in the patient's vital signs.  Two 8.5-mm ports were placed in the left lower quadrant and laterally, and one to the right lower quadrant, all under direct vision. All skin incisions  were infiltrated with a local anesthetic agent before making the incision and placing the trocars.  The patient was positioned  in reverse Trendelenburg, tilted the patient's left side down.  Da Vinci XI robot was then positioned on to the patient's left side, and docked.  The gallbladder was identified, the fundus grasped via the arm 4 Prograsp and retracted cephalad. Adhesions were lysed with scissors and cautery.  The infundibulum was identified grasped and retracted laterally, exposing the peritoneum overlying the triangle of Calot. This was then opened and dissected using cautery & scissors. An extended critical view of the cystic duct and cystic artery was obtained, aided by the ICG via FireFly which improved localization of the ductal anatomy.    The cystic duct was clearly identified and dissected to isolation.   Artery well isolated and clipped, and the cystic duct was triple clipped and divided with scissors, as close to the gallbladder neck as feasible, thus leaving two on the remaining stump.  The specimen side of the artery is sealed with bipolar and divided with monopolar scissors.  The gallbladder was taken from the gallbladder fossa in a retrograde fashion with the electrocautery. The gallbladder was removed and placed in an Endocatch bag.   The liver bed is inspected. Hemostasis was confirmed.  The robot was undocked and moved away from the operative field. The gallbladder and Endocatch sac were then removed through the infraumbilical port site.   Inspection of the right upper quadrant was performed. No bleeding, bile duct injury or leak, or bowel injury was noted. 3 L NSS irrigation was utilized and was aspirated clear. The infra-umbilical port site fascia was closed with interrumpted 0 Vicryl sutures using PMI/cone under direct visualization. Pneumoperitoneum was released and ports removed.  4-0 subcuticular Monocryl was used to close the skin. Dermabond was  applied.  The patient was then extubated and brought to the recovery room in stable condition. Sponge, lap, and needle counts were correct at closure and at the conclusion of the case.               Campbell Lerner, M.D., [facility] 03/18/2023 11:07 AM
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
## 2023-03-18 NOTE — Anesthesia Postprocedure Evaluation (Signed)
Anesthesia Post Note  Patient: Liah S Weipert  Procedure(s) Performed: XI ROBOTIC ASSISTED LAPAROSCOPIC CHOLECYSTECTOMY (Abdomen) INDOCYANINE GREEN FLUORESCENCE IMAGING (ICG) (Abdomen)  Patient location during evaluation: PACU Anesthesia Type: General Level of consciousness: awake and alert Pain management: pain level controlled Vital Signs Assessment: post-procedure vital signs reviewed and stable Respiratory status: spontaneous breathing, nonlabored ventilation, respiratory function stable and patient connected to nasal cannula oxygen Cardiovascular status: blood pressure returned to baseline and stable Postop Assessment: no apparent nausea or vomiting Anesthetic complications: no   No notable events documented.   Last Vitals:  Vitals:   03/18/23 1212 03/18/23 1232  BP: 116/85 136/68  Pulse: (!) 48 (!) 48  Resp: 18 18  Temp: 36.6 C (!) 36.2 C  SpO2: 100% 99%    Last Pain:  Vitals:   03/18/23 1232  TempSrc: Temporal  PainSc: 4                  Louie Boston

## 2023-03-18 NOTE — Transfer of Care (Signed)
Immediate Anesthesia Transfer of Care Note  Patient: Tammy Frey  Procedure(s) Performed: XI ROBOTIC ASSISTED LAPAROSCOPIC CHOLECYSTECTOMY (Abdomen) INDOCYANINE GREEN FLUORESCENCE IMAGING (ICG) (Abdomen)  Patient Location: PACU  Anesthesia Type:General  Level of Consciousness: awake and alert   Airway & Oxygen Therapy: Patient Spontanous Breathing and Patient connected to face mask  Post-op Assessment: Report given to RN and Post -op Vital signs reviewed and stable  Post vital signs: Reviewed and stable  Last Vitals:  Vitals Value Taken Time  BP    Temp    Pulse 63 03/18/23 1120  Resp 20 03/18/23 1120  SpO2 100 % 03/18/23 1120  Vitals shown include unvalidated device data.  Last Pain:  Vitals:   03/18/23 0830  TempSrc: Tympanic  PainSc: 0-No pain         Complications: No notable events documented.

## 2023-03-18 NOTE — Interval H&P Note (Signed)
History and Physical Interval Note:  03/18/2023 9:12 AM  Tammy Frey  has presented today for surgery, with the diagnosis of chronic calculous cholecystitis.  The various methods of treatment have been discussed with the patient and family. After consideration of risks, benefits and other options for treatment, the patient has consented to  Procedure(s): XI ROBOTIC ASSISTED LAPAROSCOPIC CHOLECYSTECTOMY (N/A) INDOCYANINE GREEN FLUORESCENCE IMAGING (ICG) (N/A) as a surgical intervention.  The patient's history has been reviewed, patient examined, no change in status, stable for surgery.  I have reviewed the patient's chart and labs.  Questions were answered to the patient's satisfaction.     Campbell Lerner

## 2023-03-18 NOTE — Discharge Instructions (Signed)

## 2023-03-18 NOTE — Anesthesia Procedure Notes (Signed)
Procedure Name: Intubation Date/Time: 03/18/2023 9:49 AM  Performed by: Danelle Berry, CRNAPre-anesthesia Checklist: Patient identified, Emergency Drugs available, Suction available and Patient being monitored Patient Re-evaluated:Patient Re-evaluated prior to induction Oxygen Delivery Method: Circle system utilized Preoxygenation: Pre-oxygenation with 100% oxygen Induction Type: IV induction Ventilation: Mask ventilation without difficulty Laryngoscope Size: McGraph and 3 Tube type: Oral Tube size: 7.0 mm Number of attempts: 1 Airway Equipment and Method: Stylet and Oral airway Placement Confirmation: ETT inserted through vocal cords under direct vision, positive ETCO2 and breath sounds checked- equal and bilateral Tube secured with: Tape Dental Injury: Teeth and Oropharynx as per pre-operative assessment

## 2023-04-02 ENCOUNTER — Encounter: Payer: Self-pay | Admitting: Physician Assistant

## 2023-04-02 ENCOUNTER — Ambulatory Visit (INDEPENDENT_AMBULATORY_CARE_PROVIDER_SITE_OTHER): Payer: BLUE CROSS/BLUE SHIELD | Admitting: Physician Assistant

## 2023-04-02 VITALS — BP 145/93 | HR 82 | Temp 98.4°F | Ht 67.0 in | Wt 132.8 lb

## 2023-04-02 DIAGNOSIS — Z09 Encounter for follow-up examination after completed treatment for conditions other than malignant neoplasm: Secondary | ICD-10-CM

## 2023-04-02 DIAGNOSIS — K8010 Calculus of gallbladder with chronic cholecystitis without obstruction: Secondary | ICD-10-CM

## 2023-04-02 NOTE — Progress Notes (Signed)
[facility] POST-OP OFFICE VISIT  04/02/2023  HPI: Tammy Frey is a 37 y.o. female 15 days s/p robotic assisted laparoscopic cholecystectomy for [facility]  She is overall doing well No abdominal pain, nausea, emesis, fever, chills Wounds are healing well; itching Tolerating PO; limiting fats; stools are loose but not diarrhea No other complaints   Vital signs: BP (!) 145/93   Pulse 82   Temp 98.4 F (36.9 C) (Oral)   Ht 5\' 7"  (1.702 m)   Wt 132 lb 12.8 oz (60.2 kg)   LMP 03/05/2023 (Approximate)   SpO2 97%   BMI 20.80 kg/m    Physical Exam: Constitutional: Well appearing female, NAD Abdomen: Soft, non-tender, non-distended, no rebound/guarding Skin: Laparoscopic incisions are healing well, no erythema or drainage   Assessment/Plan: This is a 37 y.o. female 15 days s/p robotic assisted laparoscopic cholecystectomy for CCC   - Okay to gradually and cautiously reintroduce fatty food into diet  - Pain control prn  - Reviewed wound care recommendation  - Reviewed lifting restrictions; 4 weeks total - work note given  - Reviewed surgical pathology; CCC  - She can follow up on as needed basis; She understands to call with questions/concerns  -- Lynden Oxford, PA-C [facility] 04/02/2023, 2:41 PM M-F: 7am - 4pm

## 2023-04-02 NOTE — Patient Instructions (Signed)

## 2024-08-27 ENCOUNTER — Other Ambulatory Visit: Payer: Self-pay

## 2024-08-27 ENCOUNTER — Encounter: Payer: Self-pay | Admitting: Oncology

## 2024-08-27 ENCOUNTER — Emergency Department
Admission: EM | Admit: 2024-08-27 | Discharge: 2024-08-27 | Disposition: A | Discharge status: Home / Self Care | Attending: Emergency Medicine | Admitting: Emergency Medicine

## 2024-08-27 DIAGNOSIS — T2153XA Corrosion of first degree of upper back, initial encounter: Secondary | ICD-10-CM | POA: Insufficient documentation

## 2024-08-27 DIAGNOSIS — T551X3A Toxic effect of detergents, assault, initial encounter: Secondary | ICD-10-CM | POA: Insufficient documentation

## 2024-08-27 DIAGNOSIS — Y929 Unspecified place or not applicable: Secondary | ICD-10-CM | POA: Diagnosis not present

## 2024-08-27 DIAGNOSIS — T304 Corrosion of unspecified body region, unspecified degree: Secondary | ICD-10-CM

## 2024-08-27 DIAGNOSIS — L242 Irritant contact dermatitis due to solvents: Secondary | ICD-10-CM | POA: Insufficient documentation

## 2024-08-27 LAB — CBC WITH DIFFERENTIAL/PLATELET
Abs Immature Granulocytes: 0.05 K/uL (ref 0.00–0.07)
Basophils Absolute: 0 K/uL (ref 0.0–0.1)
Basophils Relative: 0 %
Eosinophils Absolute: 0.1 K/uL (ref 0.0–0.5)
Eosinophils Relative: 1 %
HCT: 34.3 % — ABNORMAL LOW (ref 36.0–46.0)
Hemoglobin: 11.2 g/dL — ABNORMAL LOW (ref 12.0–15.0)
Immature Granulocytes: 1 %
Lymphocytes Relative: 16 %
Lymphs Abs: 1.7 K/uL (ref 0.7–4.0)
MCH: 31.3 pg (ref 26.0–34.0)
MCHC: 32.7 g/dL (ref 30.0–36.0)
MCV: 95.8 fL (ref 80.0–100.0)
Monocytes Absolute: 0.8 K/uL (ref 0.1–1.0)
Monocytes Relative: 7 %
Neutro Abs: 8.3 K/uL — ABNORMAL HIGH (ref 1.7–7.7)
Neutrophils Relative %: 75 %
Platelets: 175 K/uL (ref 150–400)
RBC: 3.58 MIL/uL — ABNORMAL LOW (ref 3.87–5.11)
RDW: 13.3 % (ref 11.5–15.5)
WBC: 11 K/uL — ABNORMAL HIGH (ref 4.0–10.5)
nRBC: 0 % (ref 0.0–0.2)

## 2024-08-27 LAB — BASIC METABOLIC PANEL WITH GFR
Anion gap: 14 (ref 5–15)
BUN: 15 mg/dL (ref 6–20)
CO2: 17 mmol/L — ABNORMAL LOW (ref 22–32)
Calcium: 7.9 mg/dL — ABNORMAL LOW (ref 8.9–10.3)
Chloride: 111 mmol/L (ref 98–111)
Creatinine, Ser: 0.66 mg/dL (ref 0.44–1.00)
GFR, Estimated: >60 mL/min (ref >60)
Glucose, Bld: 54 mg/dL — ABNORMAL LOW (ref 70–99)
Potassium: 3.2 mmol/L — ABNORMAL LOW (ref 3.5–5.1)
Sodium: 142 mmol/L (ref 135–145)

## 2024-08-27 MED ORDER — HYDROXYZINE HCL 25 MG PO TABS
25.0000 mg | ORAL_TABLET | Freq: Once | ORAL | Status: AC
  Administered 2024-08-27: 25 mg via ORAL
  Filled 2024-08-27: qty 1

## 2024-08-27 MED ORDER — TRIAMCINOLONE ACETONIDE 0.025 % EX OINT: 1.0000 | TOPICAL_OINTMENT | Freq: Two times a day (BID) | CUTANEOUS | 0 refills | Status: AC
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
## 2024-08-27 NOTE — Discharge Instructions (Addendum)
 You may use over-the-counter steroid or Benadryl  cream for any continued burning and itching pain along your back.  Please do not use these creams next to your eye
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
## 2024-08-27 NOTE — ED Notes (Signed)
 Pt given a phone to call her ride and paper scrubs to dress herself in
